# Patient Record
Sex: Female | Born: 1945 | Race: Black or African American | Hispanic: No | Smoking: Never smoker
Health system: Southern US, Community
[De-identification: ages and names within clinical notes are randomized; demographics above are authoritative.]

## PROBLEM LIST (undated history)

## (undated) DIAGNOSIS — R011 Cardiac murmur, unspecified: Secondary | ICD-10-CM

## (undated) DIAGNOSIS — I10 Essential (primary) hypertension: Secondary | ICD-10-CM

## (undated) DIAGNOSIS — T7840XA Allergy, unspecified, initial encounter: Secondary | ICD-10-CM

## (undated) HISTORY — DX: Cardiac murmur, unspecified: R01.1

## (undated) HISTORY — DX: Essential (primary) hypertension: I10

## (undated) HISTORY — DX: Allergy, unspecified, initial encounter: T78.40XA

---

## 2015-01-19 ENCOUNTER — Encounter: Payer: Self-pay | Admitting: *Deleted

## 2015-03-06 ENCOUNTER — Encounter: Payer: Self-pay | Admitting: Family Medicine

## 2015-03-06 ENCOUNTER — Ambulatory Visit (INDEPENDENT_AMBULATORY_CARE_PROVIDER_SITE_OTHER): Payer: Medicare Other | Admitting: Family Medicine

## 2015-03-06 VITALS — BP 128/74 | HR 68 | Temp 98.3°F | Resp 14 | Ht 68.0 in | Wt 176.0 lb

## 2015-03-06 DIAGNOSIS — Z0000 Encounter for general adult medical examination without abnormal findings: Secondary | ICD-10-CM

## 2015-03-06 DIAGNOSIS — J302 Other seasonal allergic rhinitis: Secondary | ICD-10-CM

## 2015-03-06 DIAGNOSIS — Z124 Encounter for screening for malignant neoplasm of cervix: Secondary | ICD-10-CM | POA: Diagnosis not present

## 2015-03-06 DIAGNOSIS — Z1239 Encounter for other screening for malignant neoplasm of breast: Secondary | ICD-10-CM

## 2015-03-06 DIAGNOSIS — Z23 Encounter for immunization: Secondary | ICD-10-CM | POA: Diagnosis not present

## 2015-03-06 DIAGNOSIS — Z13820 Encounter for screening for osteoporosis: Secondary | ICD-10-CM

## 2015-03-06 DIAGNOSIS — Z1321 Encounter for screening for nutritional disorder: Secondary | ICD-10-CM

## 2015-03-06 DIAGNOSIS — H6120 Impacted cerumen, unspecified ear: Secondary | ICD-10-CM | POA: Insufficient documentation

## 2015-03-06 DIAGNOSIS — I10 Essential (primary) hypertension: Secondary | ICD-10-CM

## 2015-03-06 DIAGNOSIS — H6123 Impacted cerumen, bilateral: Secondary | ICD-10-CM | POA: Diagnosis not present

## 2015-03-06 LAB — CBC WITH DIFFERENTIAL/PLATELET
Basophils Absolute: 0 10*3/uL (ref 0.0–0.1)
Basophils Relative: 0 % (ref 0–1)
EOS PCT: 3 % (ref 0–5)
Eosinophils Absolute: 0.1 10*3/uL (ref 0.0–0.7)
HCT: 38.9 % (ref 36.0–46.0)
HEMOGLOBIN: 12.8 g/dL (ref 12.0–15.0)
LYMPHS ABS: 1.7 10*3/uL (ref 0.7–4.0)
LYMPHS PCT: 39 % (ref 12–46)
MCH: 30.2 pg (ref 26.0–34.0)
MCHC: 32.9 g/dL (ref 30.0–36.0)
MCV: 91.7 fL (ref 78.0–100.0)
MPV: 9.7 fL (ref 8.6–12.4)
Monocytes Absolute: 0.2 10*3/uL (ref 0.1–1.0)
Monocytes Relative: 5 % (ref 3–12)
Neutro Abs: 2.3 10*3/uL (ref 1.7–7.7)
Neutrophils Relative %: 53 % (ref 43–77)
Platelets: 261 10*3/uL (ref 150–400)
RBC: 4.24 MIL/uL (ref 3.87–5.11)
RDW: 14.2 % (ref 11.5–15.5)
WBC: 4.4 10*3/uL (ref 4.0–10.5)

## 2015-03-06 LAB — TSH: TSH: 1.961 u[IU]/mL (ref 0.350–4.500)

## 2015-03-06 MED ORDER — AMLODIPINE BESYLATE 10 MG PO TABS: 10.0000 mg | ORAL_TABLET | Freq: Every day | ORAL | Status: DC

## 2015-03-06 NOTE — Progress Notes (Signed)
Patient ID: Tonya Pacheco, female   DOB: 1946/06/09, 69 y.o.   MRN: [id number]   Subjective:    Patient ID: Tonya Pacheco, female    DOB: 15-Aug-1946, 69 y.o.   MRN: [id number]  Patient presents for New Patient CPE with PAP  ratio to establish care and for new patient physical. Her previous PCP is Dr. Duard Larsen. She does not see any specialists. 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 She was born and raised in [place] she has a lot of family members here. She has 3 children who are all healthy.  History of hypertension and is was diagnosed greater than 5 years ago she has been on amlodipine 5 mg which she has done well with. She also a history of a  Of heart murmur but she did not have her see cardiology for this she has never had an echocardiogram done. Fish history of mild hyperlipidemia she was tried on Lipitor and Crestor but has significant joint pains of both of these medications therefore she's been trying to manage with her diet.    she's had some mild cough and congestion no fever mostly  And she is outside working in her garden. She does not take any antihistamines.  Colonoscopy 5 or 6 years ago.  Pap smear greater than 3 years ago mammogram greater than 3 years ago. She has never had a bone density.    she has not had shingles vaccine, pneumonia vaccine. She thinks her tetanus is up-to-date. She has not had a welcome to Medicare physical    Review Past Medical/Family/Social: per EMR   Risk Factors  Current exercise habits: walks Dietary issues discussed: None  Cardiac risk factors: HTN  Depression Screen  (Note: if answer to either of the following is "Yes", a more complete depression screening is indicated)  Over the past two weeks, have you felt down, depressed or hopeless? No Over the past two weeks, have you felt little interest or pleasure in doing things? No Have you lost  interest or pleasure in daily life? No Do you often feel hopeless? No Do you cry easily over simple problems? No   Activities of Daily Living  In your present state of health, do you have any difficulty performing the following activities?:  Driving? No  Managing money? No  Feeding yourself? No  Getting from bed to chair? No  Climbing a flight of stairs? No  Preparing food and eating?: No  Bathing or showering? No  Getting dressed: No  Getting to the toilet? No  Using the toilet:No  Moving around from place to place: No  In the past year have you fallen or had a near fall?:No  Are you sexually active? No  Do you have more than one partner? No   Hearing Difficulties: No  Do you often ask people to speak up or repeat themselves? No  Do you experience ringing or noises in your ears? No Do you have difficulty understanding soft or whispered voices? No  Do you feel that you have a problem with memory? No Do you often misplace items? No  Do you feel safe at home? Yes  Cognitive Testing  Alert? Yes Normal Appearance?Yes  Oriented to person? Yes Place? Yes  Time? Yes  Recall of three objects? Yes  Can perform simple calculations? Yes  Displays appropriate judgment?Yes  Can read the correct time from a watch  face?Yes   List the Names of Other Physician/Practitioners you currently use: None    Screening Tests / Date Colonoscopy   - UTD per pt                 Zostavax - Due Mammogram - Overdue Influenza Vaccine - Declines Tetanus/tdap- UTD  ROS: GEN- denies fatigue, fever, weight loss,weakness, recent illness HEENT- denies eye drainage, change in vision, nasal discharge, CVS- denies chest pain, palpitations RESP- denies SOB, cough, wheeze ABD- denies N/V, change in stools, abd pain GU- denies dysuria, hematuria, dribbling, incontinence MSK- denies joint pain, muscle aches, injury Neuro- denies headache, dizziness, syncope, seizure activity   PHYSICAL: GEN- NAD, alert  and oriented x3 HEENT- PERRL, EOMI, non injected sclera, pink conjunctiva, MMM, oropharynx clear, bilat ears wax impaction Neck- Supple, no thryomegaly CVS- RRR, no murmur Breast- normal symmetry, no nipple inversion,no nipple drainage, no nodules or lumps felt RESP-CTAB Nodes- no axillary nodes GU- normal external genitalia, vaginal mucosa pink and moist, cervix visualized no growth, no blood form os, minimal thin clear discharge, no CMT, no ovarian masses, uterus normal size but prolapsed Rectum- normal tone,FOBT neg, ext skin tags Psych- normal affect and mood EXT- No edema Pulses- Radial, DP- 2+    Assessment:    Annual wellness medicare exam   Plan:    During the course of the visit the patient was educated and counseled about appropriate screening and preventive services including:  Screening mammography  Colorectal cancer screening - obtain records  Bone density to be scheduled  Prevnar 13 given  Shingles vaccine. Prescription given to that she can get the vaccine at the pharmacy or Medicare part D.   Pt has end of life care/will set up   Diet review for nutrition referral? Yes ____ Not Indicated __x__  Patient Instructions (the written plan) was given to the patient.  Medicare Attestation  I have personally reviewed:  The patient's medical and social history  Their use of alcohol, tobacco or illicit drugs  Their current medications and supplements  The patient's functional ability including ADLs,fall risks, home safety risks, cognitive, and hearing and visual impairment  Diet and physical activities  Evidence for depression or mood disorders  The patient's weight, height, BMI, and visual acuity have been recorded in the chart. I have made referrals, counseling, and provided education to the patient based on review of the above and I have provided the patient with a written personalized care plan for preventive services.                  Assessment &  Plan:      Problem List Items Addressed This Visit    Seasonal allergies    Cough due to pollen, OTC anti-hismtaine      Essential hypertension, benign    Continue norvasc 5mg   Fasting labs today      Relevant Medications   aspirin 81 MG tablet   amLODipine (NORVASC) 10 MG tablet   Other Relevant Orders   CBC with Differential/Platelet (Completed)   Comprehensive metabolic panel (Completed)   Lipid panel (Completed)   TSH (Completed)   Cerumen impaction    S/p irrigation at bedside       Other Visit Diagnoses    Routine general medical examination at a health care facility    -  Primary    Breast cancer screening        Relevant Orders    MM DIGITAL SCREENING BILATERAL  Osteoporosis screening        Relevant Orders    DG Bone Density    Cervical cancer screening        Relevant Orders    PAP, ThinPrep ASCUS Rflx HPV Rflx Type    Encounter for vitamin deficiency screening        Relevant Orders    Vit D  25 hydroxy (rtn osteoporosis monitoring) (Completed)       Note: This dictation was prepared with Dragon dictation along with smaller phrase technology. Any transcriptional errors that result from this process are unintentional.

## 2015-03-06 NOTE — Patient Instructions (Addendum)
Release of records- [facility] [place] I recommend eye visit once a year I recommend dental visit every 6 months Goal is to  Exercise 30 minutes 5 days a week We will send a letter with lab results  Prevnar 13 given Shingles vaccine sent to your pharmacy Schedule your Mammogram and Bone Density Continue blood pressure medication Try anti-histamine for the allergies F/U 6 months or as needed

## 2015-03-07 LAB — COMPREHENSIVE METABOLIC PANEL
ALT: 13 U/L (ref 0–35)
AST: 15 U/L (ref 0–37)
Albumin: 4.1 g/dL (ref 3.5–5.2)
Alkaline Phosphatase: 67 U/L (ref 39–117)
BILIRUBIN TOTAL: 0.4 mg/dL (ref 0.2–1.2)
BUN: 15 mg/dL (ref 6–23)
CALCIUM: 9.3 mg/dL (ref 8.4–10.5)
CHLORIDE: 106 meq/L (ref 96–112)
CO2: 25 meq/L (ref 19–32)
CREATININE: 0.76 mg/dL (ref 0.50–1.10)
Glucose, Bld: 106 mg/dL — ABNORMAL HIGH (ref 70–99)
Potassium: 4.2 mEq/L (ref 3.5–5.3)
SODIUM: 140 meq/L (ref 135–145)
Total Protein: 6.9 g/dL (ref 6.0–8.3)

## 2015-03-07 LAB — LIPID PANEL
Cholesterol: 273 mg/dL — ABNORMAL HIGH (ref 0–200)
HDL: 82 mg/dL (ref >=46)
LDL Cholesterol: 177 mg/dL — ABNORMAL HIGH (ref 0–99)
Total CHOL/HDL Ratio: 3.3 Ratio
Triglycerides: 70 mg/dL (ref <150)
VLDL: 14 mg/dL (ref 0–40)

## 2015-03-07 LAB — VITAMIN D 25 HYDROXY (VIT D DEFICIENCY, FRACTURES): Vit D, 25-Hydroxy: 27 ng/mL — ABNORMAL LOW (ref 30–100)

## 2015-03-07 LAB — PAP THINPREP ASCUS RFLX HPV RFLX TYPE

## 2015-03-07 MED ORDER — ZOSTER VACCINE LIVE 19400 UNT/0.65ML ~~LOC~~ SOLR: 0.6500 mL | Freq: Once | SUBCUTANEOUS | Status: DC

## 2015-03-07 NOTE — Assessment & Plan Note (Signed)
S/p irrigation at bedside 

## 2015-03-07 NOTE — Assessment & Plan Note (Signed)
Continue norvasc 5mg   Fasting labs today

## 2015-03-07 NOTE — Addendum Note (Signed)
Addended by: Phillips OdorSIX, Jaylenn Baiza H on: 03/07/2015 10:03 AM   Modules accepted: Orders

## 2015-03-07 NOTE — Assessment & Plan Note (Signed)
Cough due to pollen, OTC anti-hismtaine

## 2015-03-10 ENCOUNTER — Other Ambulatory Visit: Payer: Self-pay | Admitting: *Deleted

## 2015-03-10 MED ORDER — VITAMIN D2 50 MCG (2000 UT) PO TABS: 2000.0000 [IU] | ORAL_TABLET | Freq: Every day | ORAL | Status: DC

## 2015-03-10 MED ORDER — EZETIMIBE 10 MG PO TABS: 10.0000 mg | ORAL_TABLET | Freq: Every day | ORAL | Status: DC

## 2015-03-21 ENCOUNTER — Telehealth: Payer: Self-pay | Admitting: Family Medicine

## 2015-03-21 NOTE — Telephone Encounter (Signed)
Has been on Zetia for about a week now.  She says her eyes are puffy and her tongue feels like it wants to swell.  Is this from Zetia??

## 2015-03-21 NOTE — Telephone Encounter (Signed)
Possible reaction, though not typical, have her stop the Zetia , take benadryl every 6 hours, go to ER if she gets tongue swelling

## 2015-03-22 NOTE — Telephone Encounter (Signed)
Pt made aware of provider recommendations.  Zetia added to allergy list

## 2015-03-29 ENCOUNTER — Ambulatory Visit (HOSPITAL_COMMUNITY): Payer: Self-pay

## 2015-03-29 ENCOUNTER — Ambulatory Visit (HOSPITAL_COMMUNITY)
Admission: RE | Admit: 2015-03-29 | Discharge: 2015-03-29 | Disposition: A | Discharge status: Home / Self Care | Payer: Medicare Other | Source: Ambulatory Visit | Attending: Family Medicine | Admitting: Family Medicine

## 2015-03-29 ENCOUNTER — Other Ambulatory Visit: Payer: Self-pay | Admitting: Family Medicine

## 2015-03-29 DIAGNOSIS — Z1231 Encounter for screening mammogram for malignant neoplasm of breast: Secondary | ICD-10-CM | POA: Diagnosis present

## 2015-04-10 ENCOUNTER — Ambulatory Visit (HOSPITAL_COMMUNITY)
Admission: RE | Admit: 2015-04-10 | Discharge: 2015-04-10 | Disposition: A | Discharge status: Home / Self Care | Payer: Medicare Other | Source: Ambulatory Visit | Attending: Family Medicine | Admitting: Family Medicine

## 2015-04-10 DIAGNOSIS — Z780 Asymptomatic menopausal state: Secondary | ICD-10-CM | POA: Insufficient documentation

## 2015-04-10 DIAGNOSIS — Z13820 Encounter for screening for osteoporosis: Secondary | ICD-10-CM | POA: Diagnosis present

## 2015-04-12 ENCOUNTER — Encounter: Payer: Self-pay | Admitting: *Deleted

## 2015-09-06 ENCOUNTER — Encounter: Payer: Self-pay | Admitting: Family Medicine

## 2015-09-06 ENCOUNTER — Ambulatory Visit (INDEPENDENT_AMBULATORY_CARE_PROVIDER_SITE_OTHER): Payer: Medicare Other | Admitting: Family Medicine

## 2015-09-06 VITALS — BP 134/76 | HR 74 | Temp 97.9°F | Resp 12 | Ht 68.0 in | Wt 177.0 lb

## 2015-09-06 DIAGNOSIS — I10 Essential (primary) hypertension: Secondary | ICD-10-CM | POA: Diagnosis not present

## 2015-09-06 DIAGNOSIS — E785 Hyperlipidemia, unspecified: Secondary | ICD-10-CM

## 2015-09-06 NOTE — Patient Instructions (Signed)
Work on [employer] diet like we discussed Recheck labs in 3 months F/U 6 months  Fat and Cholesterol Restricted Diet Getting too much fat and cholesterol in your diet may cause health problems. Following this diet helps keep your fat and cholesterol at normal levels. This can keep you from getting sick. WHAT TYPES OF FAT SHOULD I CHOOSE?  Choose monosaturated and polyunsaturated fats. These are found in foods such as olive oil, canola oil, flaxseeds, walnuts, almonds, and seeds.  Eat more omega-3 fats. Good choices include salmon, mackerel, sardines, tuna, flaxseed oil, and ground flaxseeds.  Limit saturated fats. These are in animal products such as meats, butter, and cream. They can also be in plant products such as palm oil, palm kernel oil, and coconut oil.   Avoid foods with partially hydrogenated oils in them. These contain trans fats. Examples of foods that have trans fats are stick margarine, some tub margarines, cookies, crackers, and other baked goods. WHAT GENERAL GUIDELINES DO I NEED TO FOLLOW?   Check food labels. Look for the words "trans fat" and "saturated fat."  When preparing a meal:  Fill half of your plate with vegetables and green salads.  Fill one fourth of your plate with whole grains. Look for the word "whole" as the first word in the ingredient list.  Fill one fourth of your plate with lean protein foods.  Limit fruit to two servings a day. Choose fruit instead of juice.  Eat more foods with soluble fiber. Examples of foods with this type of fiber are apples, broccoli, carrots, beans, peas, and barley. Try to get 20-30 g (grams) of fiber per day.  Eat more home-cooked foods. Eat less at restaurants and buffets.  Limit or avoid alcohol.  Limit foods high in starch and sugar.  Limit fried foods.  Cook foods without frying them. Baking, boiling, grilling, and broiling are all great options.  Lose weight if you are overweight. Losing even a small amount of weight  can help your overall health. It can also help prevent diseases such as diabetes and heart disease. WHAT FOODS CAN I EAT? Grains Whole grains, such as whole wheat or whole grain breads, crackers, cereals, and pasta. Unsweetened oatmeal, bulgur, barley, quinoa, or brown rice. Corn or whole wheat flour tortillas. Vegetables Fresh or frozen vegetables (raw, steamed, roasted, or grilled). Green salads. Fruits All fresh, canned (in natural juice), or frozen fruits. Meat and Other Protein Products Ground beef (85% or leaner), grass-fed beef, or beef trimmed of fat. Skinless chicken or [place]. Ground chicken or [place]. Pork trimmed of fat. All fish and seafood. Eggs. Dried beans, peas, or lentils. Unsalted nuts or seeds. Unsalted canned or dry beans. Dairy Low-fat dairy products, such as skim or 1% milk, 2% or reduced-fat cheeses, low-fat ricotta or cottage cheese, or plain low-fat yogurt. Fats and Oils Tub margarines without trans fats. Light or reduced-fat mayonnaise and salad dressings. Avocado. Olive, canola, sesame, or safflower oils. Natural peanut or almond butter (choose ones without added sugar and oil). The items listed above may not be a complete list of recommended foods or beverages. Contact your dietitian for more options. WHAT FOODS ARE NOT RECOMMENDED? Grains White bread. White pasta. White rice. Cornbread. Bagels, pastries, and croissants. Crackers that contain trans fat. Vegetables White potatoes. Corn. Creamed or fried vegetables. Vegetables in a cheese sauce. Fruits Dried fruits. Canned fruit in light or heavy syrup. Fruit juice. Meat and Other Protein Products Fatty cuts of meat. Ribs, chicken wings, bacon, sausage,  bologna, salami, chitterlings, fatback, hot dogs, bratwurst, and packaged luncheon meats. Liver and organ meats. Dairy Whole or 2% milk, cream, half-and-half, and cream cheese. Whole milk cheeses. Whole-fat or sweetened yogurt. Full-fat cheeses. Nondairy creamers  and whipped toppings. Processed cheese, cheese spreads, or cheese curds. Sweets and Desserts Corn syrup, sugars, honey, and molasses. Candy. Jam and jelly. Syrup. Sweetened cereals. Cookies, pies, cakes, donuts, muffins, and ice cream. Fats and Oils Butter, stick margarine, lard, shortening, ghee, or bacon fat. Coconut, palm kernel, or palm oils. Beverages Alcohol. Sweetened drinks (such as sodas, lemonade, and fruit drinks or punches). The items listed above may not be a complete list of foods and beverages to avoid. Contact your dietitian for more information.   This information is not intended to replace advice given to you by your health care provider. Make sure you discuss any questions you have with your health care provider.   Document Released: 04/14/2012 Document Revised: 11/04/2014 Document Reviewed: 01/13/2014 Elsevier Interactive Patient Education [employer]

## 2015-09-06 NOTE — Assessment & Plan Note (Signed)
Blood pressure well controlled to change medication 

## 2015-09-06 NOTE — Assessment & Plan Note (Signed)
I spent approx 15 minutes with patient with greater than 50% spent on counseling for nutrition and cholesterol she will return in 3 months to have her fasting labs done. She was given handouts about cholesterol which foods she incorporate into her diet.

## 2015-09-06 NOTE — Progress Notes (Signed)
Patient ID: Tonya Pacheco, female   DOB: 03-Dec-1945, 69 y.o.   MRN: [id number]   Subjective:    Patient ID: Tonya Pacheco, female    DOB: 03-Dec-1945, 69 y.o.   MRN: [id number]  Patient presents for 6 month F/U  patient here to follow-up medications. She has hypertension and hyperlipidemia. She wanted to discuss the foods that she was eating how this contributes to her cholesterol. She is intolerant of statins we try her on Saturday of this also caused side effects. She is taking fish oil. She does not want to have her labs checked today because she did not change anything with her diagnosis and her cholesterol still be elevated.    Review Of Systems:  GEN- denies fatigue, fever, weight loss,weakness, recent illness HEENT- denies eye drainage, change in vision, nasal discharge, CVS- denies chest pain, palpitations RESP- denies SOB, cough, wheeze ABD- denies N/V, change in stools, abd pain GU- denies dysuria, hematuria, dribbling, incontinence MSK- denies joint pain, muscle aches, injury Neuro- denies headache, dizziness, syncope, seizure activity       Objective:    BP 134/76 mmHg  Pulse 74  Temp(Src) 97.9 F (36.6 C) (Oral)  Resp 12  Ht 5\' 8"  (1.727 m)  Wt 177 lb (80.287 kg)  BMI 26.92 kg/m2 GEN- NAD, alert and oriented x3 CVS- RRR,3/6 SEM murmur RESP-CTAB EXT- No edema Pulses- Radial - 2+        Assessment & Plan:      Problem List Items Addressed This Visit    Hyperlipidemia    I spent approx 15 minutes with patient with greater than 50% spent on counseling for nutrition and cholesterol she will return in 3 months to have her fasting labs done. She was given handouts about cholesterol which foods she incorporate into her diet.      Relevant Orders   Lipid panel   Essential hypertension, benign - Primary    Blood pressure well controlled to change medication      Relevant Orders   Comprehensive metabolic panel      Note: This dictation was prepared with Dragon  dictation along with smaller phrase technology. Any transcriptional errors that result from this process are unintentional.

## 2015-12-07 ENCOUNTER — Ambulatory Visit: Payer: Medicare Other

## 2016-06-18 ENCOUNTER — Other Ambulatory Visit: Payer: Self-pay | Admitting: Family Medicine

## 2016-06-19 ENCOUNTER — Encounter: Payer: Self-pay | Admitting: *Deleted

## 2016-06-19 NOTE — Telephone Encounter (Signed)
Medication filled x1 with no refills.   Requires office visit before any further refills can be given.   Letter sent.  

## 2016-07-30 ENCOUNTER — Ambulatory Visit (INDEPENDENT_AMBULATORY_CARE_PROVIDER_SITE_OTHER): Payer: Medicare Other | Admitting: Family Medicine

## 2016-07-30 ENCOUNTER — Encounter: Payer: Self-pay | Admitting: Family Medicine

## 2016-07-30 VITALS — BP 134/74 | HR 86 | Temp 98.3°F | Resp 12 | Ht 68.0 in | Wt 178.0 lb

## 2016-07-30 DIAGNOSIS — Z0000 Encounter for general adult medical examination without abnormal findings: Secondary | ICD-10-CM | POA: Diagnosis not present

## 2016-07-30 DIAGNOSIS — E7800 Pure hypercholesterolemia, unspecified: Secondary | ICD-10-CM | POA: Diagnosis not present

## 2016-07-30 DIAGNOSIS — M8589 Other specified disorders of bone density and structure, multiple sites: Secondary | ICD-10-CM

## 2016-07-30 DIAGNOSIS — M8580 Other specified disorders of bone density and structure, unspecified site: Secondary | ICD-10-CM | POA: Insufficient documentation

## 2016-07-30 DIAGNOSIS — I10 Essential (primary) hypertension: Secondary | ICD-10-CM

## 2016-07-30 DIAGNOSIS — H6123 Impacted cerumen, bilateral: Secondary | ICD-10-CM | POA: Diagnosis not present

## 2016-07-30 DIAGNOSIS — E559 Vitamin D deficiency, unspecified: Secondary | ICD-10-CM | POA: Diagnosis not present

## 2016-07-30 DIAGNOSIS — Z23 Encounter for immunization: Secondary | ICD-10-CM

## 2016-07-30 LAB — CBC WITH DIFFERENTIAL/PLATELET
Basophils Absolute: 0 cells/uL (ref 0–200)
Basophils Relative: 0 %
EOS ABS: 138 {cells}/uL (ref 15–500)
EOS PCT: 3 %
HEMATOCRIT: 39 % (ref 35.0–45.0)
Hemoglobin: 13 g/dL (ref 12.0–15.0)
LYMPHS PCT: 39 %
Lymphs Abs: 1794 cells/uL (ref 850–3900)
MCH: 30.6 pg (ref 27.0–33.0)
MCHC: 33.3 g/dL (ref 32.0–36.0)
MCV: 91.8 fL (ref 80.0–100.0)
MONOS PCT: 6 %
MPV: 9.7 fL (ref 7.5–12.5)
Monocytes Absolute: 276 cells/uL (ref 200–950)
Neutro Abs: 2392 cells/uL (ref 1500–7800)
Neutrophils Relative %: 52 %
Platelets: 218 10*3/uL (ref 140–400)
RBC: 4.25 MIL/uL (ref 3.80–5.10)
RDW: 14 % (ref 11.0–15.0)
WBC: 4.6 10*3/uL (ref 3.8–10.8)

## 2016-07-30 NOTE — Assessment & Plan Note (Signed)
Pt did not tolerate irrigation at bedside, can use peroxide rinse at thome

## 2016-07-30 NOTE — Assessment & Plan Note (Signed)
Well controlled 

## 2016-07-30 NOTE — Progress Notes (Signed)
Subjective:   Patient presents for Medicare Annual/Subsequent preventive examination.   Hypertension- taking BP meds as prescribed  Specific concerns today. He is taking her vitamin supplements as well. Occasionally she gets some back pain which she is lifting things above her head especially [place] she moves things around like rocks. Denies any change in bowel or bladder TO numbness pain does not last.  Review Past Medical/Family/Social: per EMR    Risk Factors  Current exercise habits: walks, active in garden Dietary issues discussed: yes  Cardiac risk factors: Cholesterol, hypertension   Depression Screen  (Note: if answer to either of the following is "Yes", a more complete depression screening is indicated)  Over the past two weeks, have you felt down, depressed or hopeless? No Over the past two weeks, have you felt little interest or pleasure in doing things? No Have you lost interest or pleasure in daily life? No Do you often feel hopeless? No Do you cry easily over simple problems? No   Activities of Daily Living  In your present state of health, do you have any difficulty performing the following activities?:  Driving? No  Managing money? No  Feeding yourself? No  Getting from bed to chair? No  Climbing a flight of stairs? No  Preparing food and eating?: No  Bathing or showering? No  Getting dressed: No  Getting to the toilet? No  Using the toilet:No  Moving around from place to place: No  In the past year have you fallen or had a near fall?:No  Are you sexually active? No  Do you have more than one partner? No   Hearing Difficulties: No  Do you often ask people to speak up or repeat themselves? No  Do you experience ringing or noises in your ears? No Do you have difficulty understanding soft or whispered voices? No  Do you feel that you have a problem with memory? No Do you often misplace items? No  Do you feel safe at home? Yes  Cognitive Testing   Alert? Yes Normal Appearance?Yes  Oriented to person? Yes Place? Yes  Time? Yes  Recall of three objects? Yes  Can perform simple calculations? Yes  Displays appropriate judgment?Yes  Can read the correct time from a watch face?Yes   List the Names of Other Physician/Practitioners you currently use:   Screening Tests / Date Colonoscopy    UTD per pt                  Zostavax  Declines  Mammogram UTD Bone Density- UTD  Influenza Vaccine Declines Tetanus/tdap UTD  Pneumonia- Due for pneumonia vaccine 23   ROS: GEN- denies fatigue, fever, weight loss,weakness, recent illness HEENT- denies eye drainage, change in vision, nasal discharge, CVS- denies chest pain, palpitations RESP- denies SOB, cough, wheeze ABD- denies N/V, change in stools, abd pain GU- denies dysuria, hematuria, dribbling, incontinence MSK- + joint pain, muscle aches, injury Neuro- denies headache, dizziness, syncope, seizure activity  PHYSICAL: GEN- NAD, alert and oriented x3 HEENT- PERRL, EOMI, non injected sclera, pink conjunctiva, MMM, oropharynx clear, Bilat canals impacted with wax, hearing in tact  Neck- Supple, no thryomegaly,no bruits CVS- RRR,3/6 SEM RESP-CTAB ABD-NABS,soft,NT,ND MSK- Spine NT, good ROM, neg SLR, good ROM bilat Hips EXT- No edema Pulses- Radial, DP- 2+   Assessment:    Annual wellness medicare exam   Plan:    During the course of the visit the patient was educated and counseled about appropriate screening and preventive services  including:  Screening mammography - every 2 years  Colorectal cancer screening -try to get report from [place] again, told normal, so would be due in 2021  Fasting labs today for cholesterol   Screen NEG for depression.   Diet review for nutrition referral? Yes ____ Not Indicated __x__  Patient Instructions (the written plan) was given to the patient.  Medicare Attestation  I have personally reviewed:  The patient's medical and social history   Their use of alcohol, tobacco or illicit drugs  Their current medications and supplements  The patient's functional ability including ADLs,fall risks, home safety risks, cognitive, and hearing and visual impairment  Diet and physical activities  Evidence for depression or mood disorders  The patient's weight, height, BMI, and visual acuity have been recorded in the chart. I have made referrals, counseling, and provided education to the patient based on review of the above and I have provided the patient with a written personalized care plan for preventive services.

## 2016-07-30 NOTE — Patient Instructions (Addendum)
Release of records- [facility] [place]- Need Colonoscopy report  I recommend eye visit once a year I recommend dental visit every 6 months Goal is to  Exercise 30 minutes 5 days a week We will send a letter with lab results  F/U 1 year for Physical

## 2016-07-30 NOTE — Assessment & Plan Note (Signed)
Continue Vitamin D weight bearing exercise, recheck in 2017

## 2016-07-31 LAB — COMPREHENSIVE METABOLIC PANEL
ALK PHOS: 75 U/L (ref 33–130)
ALT: 12 U/L (ref 6–29)
AST: 17 U/L (ref 10–35)
Albumin: 4.1 g/dL (ref 3.6–5.1)
BUN: 16 mg/dL (ref 7–25)
CALCIUM: 9.1 mg/dL (ref 8.6–10.4)
CO2: 28 mmol/L (ref 20–31)
Chloride: 105 mmol/L (ref 98–110)
Creat: 1 mg/dL — ABNORMAL HIGH (ref 0.50–0.99)
Glucose, Bld: 88 mg/dL (ref 70–99)
POTASSIUM: 4.2 mmol/L (ref 3.5–5.3)
Sodium: 140 mmol/L (ref 135–146)
Total Bilirubin: 0.6 mg/dL (ref 0.2–1.2)
Total Protein: 6.9 g/dL (ref 6.1–8.1)

## 2016-07-31 LAB — LIPID PANEL
CHOL/HDL RATIO: 4 ratio (ref <=5.0)
Cholesterol: 282 mg/dL — ABNORMAL HIGH (ref 125–200)
HDL: 71 mg/dL (ref >=46)
LDL CALC: 193 mg/dL — AB (ref <130)
Triglycerides: 88 mg/dL (ref <150)
VLDL: 18 mg/dL (ref <30)

## 2016-07-31 LAB — VITAMIN D 25 HYDROXY (VIT D DEFICIENCY, FRACTURES): VIT D 25 HYDROXY: 31 ng/mL (ref 30–100)

## 2016-08-15 ENCOUNTER — Other Ambulatory Visit: Payer: Self-pay | Admitting: Family Medicine

## 2016-10-11 ENCOUNTER — Other Ambulatory Visit: Payer: Self-pay | Admitting: Family Medicine

## 2016-12-17 ENCOUNTER — Telehealth: Payer: Self-pay | Admitting: Family Medicine

## 2016-12-17 MED ORDER — AMLODIPINE BESYLATE 10 MG PO TABS: 10.0000 mg | ORAL_TABLET | Freq: Every day | ORAL | 3 refills | Status: DC

## 2016-12-17 NOTE — Telephone Encounter (Signed)
Pt needs refill on norvasc sent to [facility] on [address]

## 2016-12-17 NOTE — Telephone Encounter (Signed)
Prescription sent to pharmacy.

## 2017-07-21 ENCOUNTER — Other Ambulatory Visit: Payer: Self-pay | Admitting: Family Medicine

## 2017-07-21 NOTE — Telephone Encounter (Signed)
Medication refilled per protocol. 

## 2017-08-01 ENCOUNTER — Encounter: Payer: Medicare Other | Admitting: Family Medicine

## 2017-10-07 ENCOUNTER — Encounter: Payer: Medicare Other | Admitting: Family Medicine

## 2017-12-29 ENCOUNTER — Encounter: Payer: Self-pay | Admitting: Family Medicine

## 2017-12-29 ENCOUNTER — Ambulatory Visit (INDEPENDENT_AMBULATORY_CARE_PROVIDER_SITE_OTHER): Payer: Medicare Other | Admitting: Family Medicine

## 2017-12-29 ENCOUNTER — Other Ambulatory Visit: Payer: Self-pay

## 2017-12-29 VITALS — BP 124/60 | HR 72 | Temp 98.4°F | Resp 14 | Ht 68.0 in | Wt 178.0 lb

## 2017-12-29 DIAGNOSIS — I10 Essential (primary) hypertension: Secondary | ICD-10-CM

## 2017-12-29 DIAGNOSIS — Z1231 Encounter for screening mammogram for malignant neoplasm of breast: Secondary | ICD-10-CM

## 2017-12-29 DIAGNOSIS — M8589 Other specified disorders of bone density and structure, multiple sites: Secondary | ICD-10-CM | POA: Diagnosis not present

## 2017-12-29 DIAGNOSIS — Z1159 Encounter for screening for other viral diseases: Secondary | ICD-10-CM

## 2017-12-29 DIAGNOSIS — H6123 Impacted cerumen, bilateral: Secondary | ICD-10-CM

## 2017-12-29 DIAGNOSIS — Z1239 Encounter for other screening for malignant neoplasm of breast: Secondary | ICD-10-CM

## 2017-12-29 DIAGNOSIS — Z0000 Encounter for general adult medical examination without abnormal findings: Secondary | ICD-10-CM | POA: Diagnosis not present

## 2017-12-29 DIAGNOSIS — E7800 Pure hypercholesterolemia, unspecified: Secondary | ICD-10-CM

## 2017-12-29 NOTE — Patient Instructions (Signed)
I recommend eye visit once a year I recommend dental visit every 6 months Goal is to  Exercise 30 minutes 5 days a week We will call with lab results  F/U 1 year for physical    

## 2017-12-29 NOTE — Progress Notes (Signed)
Subjective:   Patient presents for Medicare Annual/Subsequent preventive examination.     [facility] a [facility]    Recently seen Dentist- given partial      HTN- taking 5mg  of norvasc    Trying to watch diet      No concerns  Review Past Medical/Family/Social:   Risk Factors  Current exercise habits: Exercise water  Dietary issues discussed: None   Cardiac risk factors: Obesity (BMI >= 30 kg/m2).   Depression Screen  (Note: if answer to either of the following is "Yes", a more complete depression screening is indicated)  Over the past two weeks, have you felt down, depressed or hopeless? No Over the past two weeks, have you felt little interest or pleasure in doing things? No Have you lost interest or pleasure in daily life? No Do you often feel hopeless? No Do you cry easily over simple problems? No   Activities of Daily Living  In your present state of health, do you have any difficulty performing the following activities?:  Driving? No  Managing money? No  Feeding yourself? No  Getting from bed to chair? No  Climbing a flight of stairs? No  Preparing food and eating?: No  Bathing or showering? No  Getting dressed: No  Getting to the toilet? No  Using the toilet:No  Moving around from place to place: No  In the past year have you fallen or had a near fall?:No  Are you sexually active? No  Do you have more than one partner? No   Hearing Difficulties: No  Do you often ask people to speak up or repeat themselves? No  Do you experience ringing or noises in your ears? No Do you have difficulty understanding soft or whispered voices? No  Do you feel that you have a problem with memory? No Do you often misplace items? No  Do you feel safe at home? Yes  Cognitive Testing  Alert? Yes Normal Appearance?Yes  Oriented to person? Yes Place? Yes  Time? Yes  Recall of three objects? Yes  Can perform simple calculations? Yes  Displays appropriate  judgment?Yes  Can read the correct time from a watch face?Yes   List the Names of Other Physician/Practitioners you currently use:   Screening Tests / Date Colonoscopy    UTD per pt                  Zostavax  Declines  Mammogram UTD Bone Density- UTD  Influenza Vaccine Declines Tetanus/tdap UTD  Pneumonia- UTD  ROS: GEN- denies fatigue, fever, weight loss,weakness, recent illness HEENT- denies eye drainage, change in vision, nasal discharge, CVS- denies chest pain, palpitations RESP- denies SOB, cough, wheeze ABD- denies N/V, change in stools, abd pain GU- denies dysuria, hematuria, dribbling, incontinence MSK- + joint pain, muscle aches, injury Neuro- denies headache, dizziness, syncope, seizure activity  PHYSICAL: GEN- NAD, alert and oriented x3 HEENT- PERRL, EOMI, non injected sclera, pink conjunctiva, MMM, oropharynx clear, Bilat canals impacted with wax, hearing in tact  Neck- Supple, no thryomegaly,no bruits CVS- RRR,3/6 SEM RESP-CTAB ABD-NABS,soft,NT,ND EXT- No edema Pulses- Radial, DP- 2  Assessment:    Annual wellness medicare exam   Plan:    During the course of the visit the patient was educated and counseled about appropriate screening and preventive services including:  Screening mammography/ Bone Density  - pt to schedule  Can not afford SHINGLES    Discussed HEP C screening   Screen Neg  for  depression.   Continue with exercise routine   Fasting labs- checklipids, trying to control with diet  HTN- BP controlled no changes  She has Living will , sister is POA, Full CODE  Osteopenia- continue Ca/vitamin D, weightbearing exercise      Diet review for nutrition referral? Yes ____ Not Indicated __x__  Patient Instructions (the written plan) was given to the patient.  Medicare Attestation  I have personally reviewed:  The patient's medical and social history  Their use of alcohol, tobacco or illicit drugs  Their current medications and  supplements  The patient's functional ability including ADLs,fall risks, home safety risks, cognitive, and hearing and visual impairment  Diet and physical activities  Evidence for depression or mood disorders  The patient's weight, height, BMI, and visual acuity have been recorded in the chart. I have made referrals, counseling, and provided education to the patient based on review of the above and I have provided the patient with a written personalized care plan for preventive services.

## 2017-12-30 LAB — LIPID PANEL
CHOL/HDL RATIO: 3.5 (calc) (ref <5.0)
Cholesterol: 262 mg/dL — ABNORMAL HIGH (ref <200)
HDL: 74 mg/dL (ref >50)
LDL CHOLESTEROL (CALC): 171 mg/dL — AB
Non-HDL Cholesterol (Calc): 188 mg/dL (calc) — ABNORMAL HIGH (ref <130)
Triglycerides: 72 mg/dL (ref <150)

## 2017-12-30 LAB — CBC WITH DIFFERENTIAL/PLATELET
Basophils Absolute: 21 cells/uL (ref 0–200)
Basophils Relative: 0.4 %
EOS PCT: 2.5 %
Eosinophils Absolute: 133 cells/uL (ref 15–500)
HCT: 40.5 % (ref 35.0–45.0)
Hemoglobin: 13.6 g/dL (ref 11.7–15.5)
Lymphs Abs: 2041 cells/uL (ref 850–3900)
MCH: 30.9 pg (ref 27.0–33.0)
MCHC: 33.6 g/dL (ref 32.0–36.0)
MCV: 92 fL (ref 80.0–100.0)
MONOS PCT: 6.8 %
MPV: 10.7 fL (ref 7.5–12.5)
NEUTROS PCT: 51.8 %
Neutro Abs: 2745 cells/uL (ref 1500–7800)
Platelets: 230 10*3/uL (ref 140–400)
RBC: 4.4 10*6/uL (ref 3.80–5.10)
RDW: 12.5 % (ref 11.0–15.0)
Total Lymphocyte: 38.5 %
WBC mixed population: 360 cells/uL (ref 200–950)
WBC: 5.3 10*3/uL (ref 3.8–10.8)

## 2017-12-30 LAB — HEPATITIS C ANTIBODY
Hepatitis C Ab: NONREACTIVE
SIGNAL TO CUT-OFF: 0.02 (ref <1.00)

## 2017-12-30 LAB — COMPREHENSIVE METABOLIC PANEL
AG Ratio: 1.6 (calc) (ref 1.0–2.5)
ALKALINE PHOSPHATASE (APISO): 82 U/L (ref 33–130)
ALT: 13 U/L (ref 6–29)
AST: 16 U/L (ref 10–35)
Albumin: 4.3 g/dL (ref 3.6–5.1)
BUN/Creatinine Ratio: 13 (calc) (ref 6–22)
BUN: 14 mg/dL (ref 7–25)
CO2: 26 mmol/L (ref 20–32)
CREATININE: 1.04 mg/dL — AB (ref 0.50–0.99)
Calcium: 9.3 mg/dL (ref 8.6–10.4)
Chloride: 106 mmol/L (ref 98–110)
GLUCOSE: 100 mg/dL — AB (ref 65–99)
Globulin: 2.7 g/dL (calc) (ref 1.9–3.7)
POTASSIUM: 4.4 mmol/L (ref 3.5–5.3)
SODIUM: 142 mmol/L (ref 135–146)
Total Bilirubin: 0.5 mg/dL (ref 0.2–1.2)
Total Protein: 7 g/dL (ref 6.1–8.1)

## 2018-01-01 ENCOUNTER — Encounter: Payer: Self-pay | Admitting: *Deleted

## 2018-01-10 ENCOUNTER — Other Ambulatory Visit: Payer: Self-pay | Admitting: Family Medicine

## 2018-02-04 ENCOUNTER — Ambulatory Visit (HOSPITAL_COMMUNITY)
Admission: RE | Admit: 2018-02-04 | Discharge: 2018-02-04 | Disposition: A | Discharge status: Home / Self Care | Payer: Medicare Other | Source: Ambulatory Visit | Attending: Family Medicine | Admitting: Family Medicine

## 2018-02-04 ENCOUNTER — Encounter: Payer: Self-pay | Admitting: *Deleted

## 2018-02-04 DIAGNOSIS — M8589 Other specified disorders of bone density and structure, multiple sites: Secondary | ICD-10-CM | POA: Diagnosis not present

## 2018-02-04 DIAGNOSIS — Z1231 Encounter for screening mammogram for malignant neoplasm of breast: Secondary | ICD-10-CM | POA: Insufficient documentation

## 2018-02-04 DIAGNOSIS — Z780 Asymptomatic menopausal state: Secondary | ICD-10-CM | POA: Diagnosis not present

## 2018-02-04 DIAGNOSIS — M8588 Other specified disorders of bone density and structure, other site: Secondary | ICD-10-CM | POA: Diagnosis not present

## 2018-02-04 DIAGNOSIS — Z1239 Encounter for other screening for malignant neoplasm of breast: Secondary | ICD-10-CM

## 2018-07-01 ENCOUNTER — Other Ambulatory Visit: Payer: Self-pay

## 2018-07-01 ENCOUNTER — Emergency Department (HOSPITAL_COMMUNITY)
Admission: EM | Admit: 2018-07-01 | Discharge: 2018-07-01 | Disposition: A | Discharge status: Home / Self Care | Payer: Medicare Other | Attending: Emergency Medicine | Admitting: Emergency Medicine

## 2018-07-01 ENCOUNTER — Encounter (HOSPITAL_COMMUNITY): Payer: Self-pay

## 2018-07-01 DIAGNOSIS — R252 Cramp and spasm: Secondary | ICD-10-CM | POA: Insufficient documentation

## 2018-07-01 DIAGNOSIS — Z79899 Other long term (current) drug therapy: Secondary | ICD-10-CM | POA: Diagnosis not present

## 2018-07-01 DIAGNOSIS — Z7982 Long term (current) use of aspirin: Secondary | ICD-10-CM | POA: Diagnosis not present

## 2018-07-01 DIAGNOSIS — M79605 Pain in left leg: Secondary | ICD-10-CM | POA: Diagnosis present

## 2018-07-01 DIAGNOSIS — M79604 Pain in right leg: Secondary | ICD-10-CM | POA: Diagnosis not present

## 2018-07-01 DIAGNOSIS — I10 Essential (primary) hypertension: Secondary | ICD-10-CM | POA: Diagnosis not present

## 2018-07-01 LAB — CBC WITH DIFFERENTIAL/PLATELET
Basophils Absolute: 0 10*3/uL (ref 0.0–0.1)
Basophils Relative: 0 %
EOS PCT: 4 %
Eosinophils Absolute: 0.2 10*3/uL (ref 0.0–0.7)
HEMATOCRIT: 39.1 % (ref 36.0–46.0)
Hemoglobin: 12.7 g/dL (ref 12.0–15.0)
LYMPHS ABS: 2.3 10*3/uL (ref 0.7–4.0)
LYMPHS PCT: 40 %
MCH: 30.6 pg (ref 26.0–34.0)
MCHC: 32.5 g/dL (ref 30.0–36.0)
MCV: 94.2 fL (ref 78.0–100.0)
MONO ABS: 0.5 10*3/uL (ref 0.1–1.0)
Monocytes Relative: 8 %
NEUTROS ABS: 2.8 10*3/uL (ref 1.7–7.7)
Neutrophils Relative %: 48 %
PLATELETS: 213 10*3/uL (ref 150–400)
RBC: 4.15 MIL/uL (ref 3.87–5.11)
RDW: 13.1 % (ref 11.5–15.5)
WBC: 5.8 10*3/uL (ref 4.0–10.5)

## 2018-07-01 LAB — BASIC METABOLIC PANEL
ANION GAP: 6 (ref 5–15)
BUN: 19 mg/dL (ref 8–23)
CO2: 27 mmol/L (ref 22–32)
Calcium: 9 mg/dL (ref 8.9–10.3)
Chloride: 107 mmol/L (ref 98–111)
Creatinine, Ser: 0.98 mg/dL (ref 0.44–1.00)
GFR calc Af Amer: >60 mL/min (ref >60)
GFR, EST NON AFRICAN AMERICAN: 57 mL/min — AB (ref >60)
Glucose, Bld: 130 mg/dL — ABNORMAL HIGH (ref 70–99)
Potassium: 4.3 mmol/L (ref 3.5–5.1)
Sodium: 140 mmol/L (ref 135–145)

## 2018-07-01 LAB — CK: Total CK: 390 U/L — ABNORMAL HIGH (ref 38–234)

## 2018-07-01 MED ORDER — METHOCARBAMOL 500 MG PO TABS: 500.0000 mg | ORAL_TABLET | Freq: Three times a day (TID) | ORAL | 0 refills | Status: DC | PRN

## 2018-07-01 MED ORDER — ACETAMINOPHEN 325 MG PO TABS
650.0000 mg | ORAL_TABLET | Freq: Once | ORAL | Status: AC
  Administered 2018-07-01: 650 mg via ORAL
  Filled 2018-07-01: qty 2

## 2018-07-01 MED ORDER — NAPROXEN 500 MG PO TABS: 500.0000 mg | ORAL_TABLET | Freq: Two times a day (BID) | ORAL | 0 refills | Status: DC | PRN

## 2018-07-01 NOTE — ED Triage Notes (Signed)
Pt was working out side Monday for several hours. Today she is experiencing left leg pain that throbs from the hip all the way down to the ankle. Heat has not helped or aleve.

## 2018-07-01 NOTE — ED Provider Notes (Signed)
[facility] EMERGENCY DEPARTMENT Provider Note   CSN: [id number] Arrival date & time: 07/01/18  0059     History   Chief Complaint Chief Complaint  Patient presents with  . Leg Pain    left    HPI Tonya Pacheco is a 72 y.o. female.  Patient presents with pain in her left leg since working outside earlier yesterday.  States she was outside for about 5 or 6 hours doing yard work.  No specific injury.  No fall.  Her pain extends from her left hip all the way down to her ankle.  She took Aleve around 9 PM without relief.  She is also tried heat.  No focal weakness, numbness or tingling.  No fever or vomiting.  No back pain.  No bowel or bladder incontinence.  Denies having this kind of pain in the past.  Believes she might of pulled a muscle.  She is also concerned about her electrolytes as she has been drinking a lot of Gatorade at home.  The history is provided by the patient.  Leg Pain      Past Medical History:  Diagnosis Date  . Hypertension     Patient Active Problem List   Diagnosis Date Noted  . Osteopenia 07/30/2016  . Hyperlipidemia 09/06/2015  . Essential hypertension, benign 03/06/2015  . Seasonal allergies 03/06/2015  . Cerumen impaction 03/06/2015    History reviewed. No pertinent surgical history.   OB History   None      Home Medications    Prior to Admission medications   Medication Sig Start Date End Date Taking? Authorizing Provider  amLODipine (NORVASC) 10 MG tablet TAKE 1 TABLET ONCE DAILY 01/12/18   Salley Scarlet, MD  aspirin 81 MG tablet Take 81 mg by mouth daily.    [provider]  multivitamin-iron-minerals-folic acid (CENTRUM) chewable tablet Chew 1 tablet by mouth daily.    [provider]    Family History Family History  Problem Relation Age of Onset  . Arthritis Sister 30       Rheumatoid Arthritis  . Cancer Maternal Grandmother   . Heart disease Sister   . Lupus Sister   . Kidney disease Brother 32   Kidney transplant    Social History Social History   Tobacco Use  . Smoking status: Never Smoker  . Smokeless tobacco: Never Used  Substance Use Topics  . Alcohol use: No  . Drug use: No     Allergies   Penicillins; Statins; and Zetia [ezetimibe]   Review of Systems Review of Systems  Constitutional: Negative for activity change, appetite change and fever.  HENT: Negative for congestion and rhinorrhea.   Eyes: Negative for visual disturbance.  Respiratory: Negative for cough, chest tightness, shortness of breath and wheezing.   Cardiovascular: Negative for chest pain and palpitations.  Gastrointestinal: Negative for abdominal pain, nausea and vomiting.  Genitourinary: Negative for dysuria, vaginal bleeding and vaginal discharge.  Musculoskeletal: Positive for arthralgias and myalgias.  Neurological: Negative for dizziness, weakness, light-headedness and headaches.   all other systems are negative except as noted in the HPI and PMH.     Physical Exam Updated Vital Signs BP (!) 165/97 (BP Location: Right Arm)   Pulse 77   Temp 98 F (36.7 C) (Oral)   Resp 18   Ht 5\' 7"  (1.702 m)   Wt 80.7 kg   SpO2 100%   BMI 27.88 kg/m   Physical Exam  Constitutional: She is oriented to person,  place, and time. She appears well-developed and well-nourished. No distress.  HENT:  Head: Normocephalic and atraumatic.  Mouth/Throat: Oropharynx is clear and moist. No oropharyngeal exudate.  Eyes: Pupils are equal, round, and reactive to light. Conjunctivae and EOM are normal.  Neck: Normal range of motion. Neck supple.  No meningismus.  Cardiovascular: Normal rate, regular rhythm, normal heart sounds and intact distal pulses.  No murmur heard. Pulmonary/Chest: Effort normal and breath sounds normal. No respiratory distress.  Abdominal: Soft. There is no tenderness. There is no rebound and no guarding.  Musculoskeletal: Normal range of motion. She exhibits no edema or tenderness.    5/5 strength in bilateral lower extremities. Ankle plantar and dorsiflexion intact. Great toe extension intact bilaterally. +2 DP and PT pulses. +2 patellar reflexes bilaterally. Normal gait.  Leg is nontender to palpation.  There is no calf asymmetry or swelling.  No calf tenderness. Compartments are soft  Neurological: She is alert and oriented to person, place, and time. No cranial nerve deficit. She exhibits normal muscle tone. Coordination normal.  No ataxia on finger to nose bilaterally. No pronator drift. 5/5 strength throughout. CN 2-12 intact.Equal grip strength. Sensation intact.   Skin: Skin is warm. Capillary refill takes less than 2 seconds.  Psychiatric: She has a normal mood and affect. Her behavior is normal.  Nursing note and vitals reviewed.    ED Treatments / Results  Labs (all labs ordered are listed, but only abnormal results are displayed) Labs Reviewed  BASIC METABOLIC PANEL - Abnormal; Notable for the following components:      Result Value   Glucose, Bld 130 (*)    GFR calc non Af Amer 57 (*)    All other components within normal limits  CK - Abnormal; Notable for the following components:   Total CK 390 (*)    All other components within normal limits  CBC WITH DIFFERENTIAL/PLATELET    EKG None  Radiology No results found.  Procedures Procedures (including critical care time)  Medications Ordered in ED Medications  acetaminophen (TYLENOL) tablet 650 mg (650 mg Oral Given 07/01/18 0256)     Initial Impression / Assessment and Plan / ED Course  I have reviewed the triage vital signs and the nursing notes.  Pertinent labs & imaging results that were available during my care of the patient were reviewed by me and considered in my medical decision making (see chart for details).    Left leg pain after working outside.  Neurovascularly intact.  Intact distal pulses, compartments soft.  No evidence of cord compression or cauda equina.  Suspect this  is muscular versus radicular in origin.  Labs are reassuring, CK minimally elevated.  Discussed p.o. hydration at home, muscle relaxers and anti-inflammatories.  Pain appears more to be muscular cramps rather than sciatica.  Patient declines trial of steroids at this time.  Advised to increase hydration at home, PCP follow-up.  Advised recheck of creatinine kinase later this week.  Return precautions discussed. Final Clinical Impressions(s) / ED Diagnoses   Final diagnoses:  Muscle cramps    ED Discharge Orders    None       Itsel Opfer, Jeannett Senior, MD 07/01/18 [phone]

## 2018-07-01 NOTE — Discharge Instructions (Signed)
Keep yourself hydrated.  Follow-up with your doctor for recheck this week.  You should have the blood test called CK rechecked to make sure it is improving.  You have declined trial of steroids today.  Return to the ED if develop new or worsening symptoms.

## 2018-07-07 ENCOUNTER — Other Ambulatory Visit: Payer: Self-pay | Admitting: Family Medicine

## 2018-07-08 ENCOUNTER — Encounter: Payer: Self-pay | Admitting: Family Medicine

## 2018-07-08 ENCOUNTER — Ambulatory Visit (INDEPENDENT_AMBULATORY_CARE_PROVIDER_SITE_OTHER): Payer: Medicare Other | Admitting: Family Medicine

## 2018-07-08 ENCOUNTER — Other Ambulatory Visit: Payer: Self-pay

## 2018-07-08 VITALS — BP 138/62 | HR 72 | Temp 98.1°F | Resp 16 | Ht 68.0 in | Wt 175.0 lb

## 2018-07-08 DIAGNOSIS — Z79899 Other long term (current) drug therapy: Secondary | ICD-10-CM | POA: Diagnosis not present

## 2018-07-08 DIAGNOSIS — Z09 Encounter for follow-up examination after completed treatment for conditions other than malignant neoplasm: Secondary | ICD-10-CM | POA: Diagnosis not present

## 2018-07-08 DIAGNOSIS — M5432 Sciatica, left side: Secondary | ICD-10-CM

## 2018-07-08 DIAGNOSIS — I10 Essential (primary) hypertension: Secondary | ICD-10-CM | POA: Diagnosis not present

## 2018-07-08 DIAGNOSIS — M5442 Lumbago with sciatica, left side: Secondary | ICD-10-CM | POA: Diagnosis not present

## 2018-07-08 DIAGNOSIS — R748 Abnormal levels of other serum enzymes: Secondary | ICD-10-CM | POA: Diagnosis not present

## 2018-07-08 DIAGNOSIS — M533 Sacrococcygeal disorders, not elsewhere classified: Secondary | ICD-10-CM | POA: Diagnosis not present

## 2018-07-08 DIAGNOSIS — Z7982 Long term (current) use of aspirin: Secondary | ICD-10-CM | POA: Insufficient documentation

## 2018-07-08 DIAGNOSIS — M79605 Pain in left leg: Secondary | ICD-10-CM | POA: Diagnosis not present

## 2018-07-08 LAB — BASIC METABOLIC PANEL WITH GFR
BUN / CREAT RATIO: 21 (calc) (ref 6–22)
BUN: 23 mg/dL (ref 7–25)
CHLORIDE: 101 mmol/L (ref 98–110)
CO2: 27 mmol/L (ref 20–32)
Calcium: 9.5 mg/dL (ref 8.6–10.4)
Creat: 1.12 mg/dL — ABNORMAL HIGH (ref 0.60–0.93)
GFR, Est African American: 58 mL/min/{1.73_m2} — ABNORMAL LOW (ref >=60)
GFR, Est Non African American: 50 mL/min/{1.73_m2} — ABNORMAL LOW (ref >=60)
GLUCOSE: 88 mg/dL (ref 65–99)
Potassium: 4.7 mmol/L (ref 3.5–5.3)
SODIUM: 137 mmol/L (ref 135–146)

## 2018-07-08 LAB — CK: Total CK: 140 U/L (ref 29–143)

## 2018-07-08 LAB — MAGNESIUM: Magnesium: 2.4 mg/dL (ref 1.5–2.5)

## 2018-07-08 MED ORDER — TRAMADOL HCL 50 MG PO TABS: 50.0000 mg | ORAL_TABLET | Freq: Three times a day (TID) | ORAL | 0 refills | Status: AC | PRN

## 2018-07-08 MED ORDER — PREDNISONE 20 MG PO TABS: 40.0000 mg | ORAL_TABLET | Freq: Every day | ORAL | 0 refills | Status: AC

## 2018-07-08 NOTE — Patient Instructions (Signed)
Piriformis stretches - look it up  Call me and let me know if it does not improve we will order physical therapy   Radicular Pain Radicular pain is a type of pain that spreads from your back or neck along a spinal nerve. Spinal nerves are nerves that leave the spinal cord and go to the muscles. Radicular pain occurs when one of these nerves becomes irritated or squeezed (compressed). Radicular pain is sometimes called radiculopathy, radiculitis, or a pinched nerve. When you have this type of pain, you may also have weakness, numbness, or tingling in the area of your body that is supplied by the nerve. The pain may feel sharp and burning. Spinal nerves leave the spinal cord through openings between the 24 bones (vertebrae) that make up the spine. Radicular pain is often caused by something pushing on a spinal nerve. This pushing may be done by a vertebra or by one of the round cushions between vertebrae (intervertebral disks). This can result from an injury, from wear and tear or aging of a disk, or from the growth of a bone spur that pushes on the nerve. Radicular pain can occur in various areas depending on which spinal nerve is affected:  Cervical radicular pain occurs in the neck. You may also feel pain, numbness, weakness, or tingling in the arms.  Thoracic radicular pain occurs in the mid-spine area. You would feel this pain in the back and chest. This type is rare.  Lumbar radicular pain occurs in the lower back area. You would feel this pain as low back pain. You may feel pain, numbness, weakness, or tingling in the buttocks or legs. Sciatica is a type of lumbar radicular pain that shoots down the back of the leg.  Radicular pain often goes away when you follow instructions from your health care provider for relieving pain at home. Follow these instructions at home: Managing pain  If directed, apply ice to the affected area: ? Put ice in a plastic bag. ? Place a towel between your skin  and the bag. ? Leave the ice on for 20 minutes, 2-3 times a day.  If directed, apply heat to the affected area as often as told by your health care provider. Use the heat source that your health care provider recommends, such as a moist heat pack or a heating pad. ? Place a towel between your skin and the heat source. ? Leave the heat on for 20-30 minutes. ? Remove the heat if your skin turns bright red. This is especially important if you are unable to feel pain, heat, or cold. You may have a greater risk of getting burned. Activity   Do not sit or rest in bed for long periods of time.  Try to stay as active as possible. Ask your health care provider what type of exercise or activity is best for you.  Avoid activities that make your pain worse, such as bending and lifting.  Do not lift anything that is heavier than 10 lb (4.5 kg). Practice using proper technique when lifting items. Proper lifting technique involves bending your knees and rising up.  Do strength and range-of-motion exercises only as told by your health care provider. General instructions  Take over-the-counter and prescription medicines only as told by your health care provider.  Pay attention to any changes in your symptoms.  Keep all follow-up visits as told by your health care provider. This is important. Contact a health care provider if:  Your pain and  other symptoms get worse.  Your pain medicine is not helping.  Your pain has not improved after a few weeks of home care.  You have a fever. Get help right away if:  You have severe pain, weakness, or numbness.  You have difficulty with bladder or bowel control. This information is not intended to replace advice given to you by your health care provider. Make sure you discuss any questions you have with your health care provider. Document Released: 11/21/2004 Document Revised: 03/21/2016 Document Reviewed: 05/10/2015 Elsevier Interactive Patient Education   2018 [employer].   Low Back Sprain Rehab Ask your health care provider which exercises are safe for you. Do exercises exactly as told by your health care provider and adjust them as directed. It is normal to feel mild stretching, pulling, tightness, or discomfort as you do these exercises, but you should stop right away if you feel sudden pain or your pain gets worse. Do not begin these exercises until told by your health care provider. Stretching and range of motion exercises These exercises warm up your muscles and joints and improve the movement and flexibility of your back. These exercises also help to relieve pain, numbness, and tingling. Exercise A: Lumbar rotation  1. Lie on your back on a firm surface and bend your knees. 2. Straighten your arms out to your sides so each arm forms an "L" shape with a side of your body (a 90 degree angle). 3. Slowly move both of your knees to one side of your body until you feel a stretch in your lower back. Try not to let your shoulders move off of the floor. 4. Hold for __________ seconds. 5. Tense your abdominal muscles and slowly move your knees back to the starting position. 6. Repeat this exercise on the other side of your body. Repeat __________ times. Complete this exercise __________ times a day. Exercise B: Prone extension on elbows  1. Lie on your abdomen on a firm surface. 2. Prop yourself up on your elbows. 3. Use your arms to help lift your chest up until you feel a gentle stretch in your abdomen and your lower back. ? This will place some of your body weight on your elbows. If this is uncomfortable, try stacking pillows under your chest. ? Your hips should stay down, against the surface that you are lying on. Keep your hip and back muscles relaxed. 4. Hold for __________ seconds. 5. Slowly relax your upper body and return to the starting position. Repeat __________ times. Complete this exercise __________ times a day. Strengthening  exercises These exercises build strength and endurance in your back. Endurance is the ability to use your muscles for a long time, even after they get tired. Exercise C: Pelvic tilt 1. Lie on your back on a firm surface. Bend your knees and keep your feet flat. 2. Tense your abdominal muscles. Tip your pelvis up toward the ceiling and flatten your lower back into the floor. ? To help with this exercise, you may place a small towel under your lower back and try to push your back into the towel. 3. Hold for __________ seconds. 4. Let your muscles relax completely before you repeat this exercise. Repeat __________ times. Complete this exercise __________ times a day. Exercise D: Alternating arm and leg raises  1. Get on your hands and knees on a firm surface. If you are on a hard floor, you may want to use padding to cushion your knees, such as an  exercise mat. 2. Line up your arms and legs. Your hands should be below your shoulders, and your knees should be below your hips. 3. Lift your left leg behind you. At the same time, raise your right arm and straighten it in front of you. ? Do not lift your leg higher than your hip. ? Do not lift your arm higher than your shoulder. ? Keep your abdominal and back muscles tight. ? Keep your hips facing the ground. ? Do not arch your back. ? Keep your balance carefully, and do not hold your breath. 4. Hold for __________ seconds. 5. Slowly return to the starting position and repeat with your right leg and your left arm. Repeat __________ times. Complete this exercise __________ times a day. Exercise E: Abdominal set with straight leg raise  1. Lie on your back on a firm surface. 2. Bend one of your knees and keep your other leg straight. 3. Tense your abdominal muscles and lift your straight leg up, 4-6 inches (10-15 cm) off the ground. 4. Keep your abdominal muscles tight and hold for __________ seconds. ? Do not hold your breath. ? Do not arch your  back. Keep it flat against the ground. 5. Keep your abdominal muscles tense as you slowly lower your leg back to the starting position. 6. Repeat with your other leg. Repeat __________ times. Complete this exercise __________ times a day. Posture and body mechanics  Body mechanics refers to the movements and positions of your body while you do your daily activities. Posture is part of body mechanics. Good posture and healthy body mechanics can help to relieve stress in your body's tissues and joints. Good posture means that your spine is in its natural S-curve position (your spine is neutral), your shoulders are pulled back slightly, and your head is not tipped forward. The following are general guidelines for applying improved posture and body mechanics to your everyday activities. Standing   When standing, keep your spine neutral and your feet about hip-width apart. Keep a slight bend in your knees. Your ears, shoulders, and hips should line up.  When you do a task in which you stand in one place for a long time, place one foot up on a stable object that is 2-4 inches (5-10 cm) high, such as a footstool. This helps keep your spine neutral. Sitting   When sitting, keep your spine neutral and keep your feet flat on the floor. Use a footrest, if necessary, and keep your thighs parallel to the floor. Avoid rounding your shoulders, and avoid tilting your head forward.  When working at a desk or a computer, keep your desk at a height where your hands are slightly lower than your elbows. Slide your chair under your desk so you are close enough to maintain good posture.  When working at a computer, place your monitor at a height where you are looking straight ahead and you do not have to tilt your head forward or downward to look at the screen. Resting   When lying down and resting, avoid positions that are most painful for you.  If you have pain with activities such as sitting, bending,  stooping, or squatting (flexion-based activities), lie in a position in which your body does not bend very much. For example, avoid curling up on your side with your arms and knees near your chest (fetal position).  If you have pain with activities such as standing for a long time or reaching with your arms (  extension-based activities), lie with your spine in a neutral position and bend your knees slightly. Try the following positions:  Lying on your side with a pillow between your knees.  Lying on your back with a pillow under your knees. Lifting   When lifting objects, keep your feet at least shoulder-width apart and tighten your abdominal muscles.  Bend your knees and hips and keep your spine neutral. It is important to lift using the strength of your legs, not your back. Do not lock your knees straight out.  Always ask for help to lift heavy or awkward objects. This information is not intended to replace advice given to you by your health care provider. Make sure you discuss any questions you have with your health care provider. Document Released: 10/14/2005 Document Revised: 06/20/2016 Document Reviewed: 07/26/2015 Elsevier Interactive Patient Education  [employer].

## 2018-07-08 NOTE — Progress Notes (Signed)
Patient ID: Tonya Pacheco, female    DOB: June 28, 1946, 72 y.o.   MRN: [id number]  PCP: Salley Scarlet, MD  Chief Complaint  Patient presents with  . ER F/U    muscle spasms  . Insomnia    Subjective:   Tonya Pacheco is a 72 y.o. female, presents to clinic with CC of low back and buttock pain with radiation to left leg, sx began a week ago leg pain worsening, she went to hospital with pain, shaking of left leg, found to have minimally elevated CK normal renal function.  She had hydrated prior to going to the ER and her symptoms were improving.  She was encouraged to hydrate and follow-up with PCP.  She states she has not had recurrence of any muscle spasms.  She has not noticed any darkening of her urine.  She has continued to drink more clear fluids than she was previously.  The day her symptoms began she was working outside in the heat in her yard for several hours, she has not done anything strenuous in the heat since then. She notes that her low back pain is minimally improved however the radiation of the sharp burning electrical pain to her left leg is worsening. Back Pain  This is a new problem. The current episode started in the past 7 days. The problem occurs constantly. The problem has been gradually worsening (low back pain better, SI joint and leg worse) since onset. The pain is present in the sacro-iliac and lumbar spine. The quality of the pain is described as shooting and aching (electrical and burning). The pain radiates to the left knee, left foot and left thigh. The pain is moderate. The pain is worse during the night. The symptoms are aggravated by lying down. Stiffness is present: no stiffness or muscle spasms. Associated symptoms include leg pain and paresthesias. Pertinent negatives include no abdominal pain, bladder incontinence, bowel incontinence, chest pain, dysuria, fever, headaches, numbness, paresis, pelvic pain, perianal numbness, tingling, weakness or weight loss. Risk  factors: no hx of CA, chronic steroid use, trauma. She has tried heat, home exercises, NSAIDs and muscle relaxant for the symptoms. The treatment provided no relief.     Patient Active Problem List   Diagnosis Date Noted  . Osteopenia 07/30/2016  . Hyperlipidemia 09/06/2015  . Essential hypertension, benign 03/06/2015  . Seasonal allergies 03/06/2015  . Cerumen impaction 03/06/2015     Prior to Admission medications   Medication Sig Start Date End Date Taking? Authorizing Provider  amLODipine (NORVASC) 10 MG tablet TAKE 1 TABLET ONCE DAILY 07/08/18  Yes Edroy, Velna Hatchet, MD  aspirin 81 MG tablet Take 81 mg by mouth daily.   Yes [provider]  methocarbamol (ROBAXIN) 500 MG tablet Take 1 tablet (500 mg total) by mouth every 8 (eight) hours as needed for muscle spasms. 07/01/18  Yes Rancour, Jeannett Senior, MD  multivitamin-iron-minerals-folic acid (CENTRUM) chewable tablet Chew 1 tablet by mouth daily.   Yes [provider]  naproxen (NAPROSYN) 500 MG tablet Take 1 tablet (500 mg total) by mouth every 12 (twelve) hours as needed for moderate pain. 07/01/18  Yes Rancour, Jeannett Senior, MD     Allergies  Allergen Reactions  . Penicillins   . Statins   . Zetia [Ezetimibe] Other (See Comments)    Eyes puffy, tongue swelling     Family History  Problem Relation Age of Onset  . Arthritis Sister 3       Rheumatoid Arthritis  .  Cancer Maternal Grandmother   . Heart disease Sister   . Lupus Sister   . Kidney disease Brother 54       Kidney transplant     Social History   Socioeconomic History  . Marital status: Widowed    Spouse name: Not on file  . Number of children: Not on file  . Years of education: Not on file  . Highest education level: Not on file  Occupational History  . Not on file  Social Needs  . Financial resource strain: Not on file  . Food insecurity:    Worry: Not on file    Inability: Not on file  . Transportation needs:    Medical: Not on file      Non-medical: Not on file  Tobacco Use  . Smoking status: Never Smoker  . Smokeless tobacco: Never Used  Substance and Sexual Activity  . Alcohol use: No  . Drug use: No  . Sexual activity: Not Currently  Lifestyle  . Physical activity:    Days per week: Not on file    Minutes per session: Not on file  . Stress: Not on file  Relationships  . Social connections:    Talks on phone: Not on file    Gets together: Not on file    Attends religious service: Not on file    Active member of club or organization: Not on file    Attends meetings of clubs or organizations: Not on file    Relationship status: Not on file  . Intimate partner violence:    Fear of current or ex partner: Not on file    Emotionally abused: Not on file    Physically abused: Not on file    Forced sexual activity: Not on file  Other Topics Concern  . Not on file  Social History Narrative  . Not on file     Review of Systems  Constitutional: Negative.  Negative for activity change, appetite change, chills, diaphoresis, fatigue, fever and weight loss.  HENT: Negative.   Respiratory: Negative.   Cardiovascular: Negative for chest pain.  Gastrointestinal: Negative for abdominal distention, abdominal pain, bowel incontinence, diarrhea, nausea and vomiting.  Genitourinary: Negative.  Negative for bladder incontinence, decreased urine volume, difficulty urinating, dyspareunia, dysuria, flank pain, frequency, genital sores, hematuria, menstrual problem, pelvic pain and urgency.       No saddle anesthesia  Musculoskeletal: Positive for back pain. Negative for arthralgias, gait problem, joint swelling, myalgias, neck pain and neck stiffness.  Skin: Negative.  Negative for color change, pallor and rash.  Neurological: Positive for paresthesias. Negative for dizziness, tingling, tremors, syncope, weakness, light-headedness, numbness and headaches.  Hematological: Negative.  Negative for adenopathy. Does not  bruise/bleed easily.  Psychiatric/Behavioral: Negative.   All other systems reviewed and are negative.      Objective:    Vitals:   07/08/18 1208  BP: 138/62  Pulse: 72  Resp: 16  Temp: 98.1 F (36.7 C)  TempSrc: Oral  SpO2: 96%  Weight: 175 lb (79.4 kg)  Height: 5\' 8"  (1.727 m)      Physical Exam  Constitutional: She is oriented to person, place, and time. She appears well-developed and well-nourished.  Non-toxic appearance. No distress.  HENT:  Head: Normocephalic and atraumatic.  Right Ear: External ear normal.  Left Ear: External ear normal.  Nose: Nose normal.  Mouth/Throat: Uvula is midline and mucous membranes are normal.  Eyes: Pupils are equal, round, and reactive to light. Conjunctivae and  lids are normal.  Neck: Normal range of motion and phonation normal. Neck supple. No tracheal deviation present.  Cardiovascular: Normal rate, regular rhythm, normal heart sounds, intact distal pulses and normal pulses. Exam reveals no gallop and no friction rub.  No murmur heard. Pulses:      Radial pulses are 2+ on the right side, and 2+ on the left side.       Posterior tibial pulses are 2+ on the right side, and 2+ on the left side.  No LE edema  Pulmonary/Chest: Effort normal and breath sounds normal. No stridor. No respiratory distress. She has no rhonchi.  Abdominal: Soft. Normal appearance and bowel sounds are normal. She exhibits no distension. There is no tenderness. There is no rebound and no guarding.  Musculoskeletal: Normal range of motion. She exhibits no edema or deformity.       Cervical back: Normal. She exhibits normal range of motion, no tenderness, no bony tenderness, no swelling, no edema and no spasm.       Thoracic back: Normal. She exhibits normal range of motion, no tenderness, no bony tenderness, no swelling, no edema, no deformity, no pain and no spasm.       Lumbar back: She exhibits normal range of motion, no tenderness, no bony tenderness, no  swelling, no edema, no pain and no spasm.  SI joint L ttp Normal ROM of back, b/l hips, knees, ankles  Lymphadenopathy:    She has no cervical adenopathy.  Neurological: She is alert and oriented to person, place, and time. She has normal strength. She displays no tremor. No sensory deficit. She exhibits normal muscle tone. Coordination and gait normal.  5/5 B/L with dorsiflexion and plantarflexion, extension at knees, flexion at hips Normal sensation to light touch to b/l LE Normal gait  Skin: Skin is warm, dry and intact. Capillary refill takes less than 2 seconds. No rash noted. She is not diaphoretic. No pallor.  Psychiatric: She has a normal mood and affect. Her speech is normal and behavior is normal. Judgment and thought content normal.  Nursing note and vitals reviewed.         Assessment & Plan:      ICD-10-CM   1. Elevated CK R74.8 BASIC METABOLIC PANEL WITH GFR    CK    Magnesium   was midly elevated, sx were brief and have resolved, recheck renal function, mag and CK  2. Sacroiliac pain M53.3 predniSONE (DELTASONE) 20 MG tablet    traMADol (ULTRAM) 50 MG tablet   left SI joint ttp, steroid burst, may need pt or imaging if not resolved  3. Sciatica of left side M54.32 predniSONE (DELTASONE) 20 MG tablet    traMADol (ULTRAM) 50 MG tablet   sciatica and parasthesias of left leg, tx with steroid burst, heat tx, freq repositioning, low back stretching, posture, lifting technique reviewed and printed  4. Encounter for examination following treatment at hospital Z09    All ER documents reviewed, meds reviewed. D/C robaxin, hold naproxen while doing steroids, hold ibuprofen until done with steroids     Danelle Berry, PA-C 07/08/18 12:29 PM

## 2018-07-09 ENCOUNTER — Emergency Department (HOSPITAL_COMMUNITY)
Admission: EM | Admit: 2018-07-09 | Discharge: 2018-07-09 | Disposition: A | Discharge status: Home / Self Care | Payer: Medicare Other | Attending: Emergency Medicine | Admitting: Emergency Medicine

## 2018-07-09 ENCOUNTER — Encounter (HOSPITAL_COMMUNITY): Payer: Self-pay | Admitting: Emergency Medicine

## 2018-07-09 ENCOUNTER — Other Ambulatory Visit: Payer: Self-pay

## 2018-07-09 DIAGNOSIS — M5432 Sciatica, left side: Secondary | ICD-10-CM

## 2018-07-09 MED ORDER — HYDROCODONE-ACETAMINOPHEN 5-325 MG PO TABS
ORAL_TABLET | ORAL | Status: AC
  Administered 2018-07-09: 2 via ORAL
  Filled 2018-07-09: qty 2

## 2018-07-09 MED ORDER — HYDROCODONE-ACETAMINOPHEN 5-325 MG PO TABS: 1.0000 | ORAL_TABLET | Freq: Four times a day (QID) | ORAL | 0 refills | Status: DC | PRN

## 2018-07-09 MED ORDER — HYDROCODONE-ACETAMINOPHEN 5-325 MG PO TABS
2.0000 | ORAL_TABLET | Freq: Once | ORAL | Status: AC
  Administered 2018-07-09: 2 via ORAL

## 2018-07-09 NOTE — ED Notes (Signed)
Patient discharged unable to sign due to system down time.

## 2018-07-09 NOTE — Discharge Instructions (Addendum)
Hydrocodone is prescribed as needed for pain.  Continue prednisone as previously prescribed.  Follow-up with your primary doctor if symptoms are not improving in the next week.

## 2018-07-09 NOTE — ED Triage Notes (Signed)
Pt C/O left leg pain from her left buttock to the the left foot. Pt states she was here 1 week ago with the same pain. Pt is taking Tramadol and prednisone with no relief.

## 2018-07-09 NOTE — ED Provider Notes (Signed)
[facility] EMERGENCY DEPARTMENT Provider Note   CSN: [id number] Arrival date & time: 07/08/18  2355     History   Chief Complaint Chief Complaint  Patient presents with  . Leg Pain    HPI Tonya Pacheco is a 72 y.o. female.  Patient is a 72 year old female with past medical history of hypertension, hyperlipidemia.  She presents for evaluation of pain in her left buttock that radiates down her left leg.  This is been present for the past week.  She was seen 1 week ago here with similar complaints, then again this afternoon by her primary doctor.  She was told she may have sciatica and was given prednisone and pain medication.  She took a dose of prednisone this evening and is now unable to sleep.  She denies any bowel or bladder complaints.  She denies any weakness or numbness.  The history is provided by the patient.  Leg Pain   This is a new problem. Episode onset: 1 week ago. The problem occurs constantly. The problem has been gradually worsening. Pain location: Left buttock. The quality of the pain is described as aching. The pain is severe. Pertinent negatives include no numbness, no stiffness and no tingling. Treatments tried: Prednisone and tramadol. The treatment provided no relief.    Past Medical History:  Diagnosis Date  . Hypertension     Patient Active Problem List   Diagnosis Date Noted  . Osteopenia 07/30/2016  . Hyperlipidemia 09/06/2015  . Essential hypertension, benign 03/06/2015  . Seasonal allergies 03/06/2015  . Cerumen impaction 03/06/2015    History reviewed. No pertinent surgical history.   OB History   None      Home Medications    Prior to Admission medications   Medication Sig Start Date End Date Taking? Authorizing Provider  amLODipine (NORVASC) 10 MG tablet TAKE 1 TABLET ONCE DAILY 07/08/18   Salley Scarlet, MD  aspirin 81 MG tablet Take 81 mg by mouth daily.    [provider]  HYDROcodone-acetaminophen (NORCO) 5-325 MG  tablet Take 1-2 tablets by mouth every 6 (six) hours as needed. 07/09/18   Geoffery Lyons, MD  methocarbamol (ROBAXIN) 500 MG tablet Take 1 tablet (500 mg total) by mouth every 8 (eight) hours as needed for muscle spasms. 07/01/18   Rancour, Jeannett Senior, MD  multivitamin-iron-minerals-folic acid (CENTRUM) chewable tablet Chew 1 tablet by mouth daily.    [provider]  naproxen (NAPROSYN) 500 MG tablet Take 1 tablet (500 mg total) by mouth every 12 (twelve) hours as needed for moderate pain. 07/01/18   Rancour, Jeannett Senior, MD  predniSONE (DELTASONE) 20 MG tablet Take 2 tablets (40 mg total) by mouth daily with breakfast for 5 days. 07/08/18 07/13/18  Danelle Berry, PA-C  traMADol (ULTRAM) 50 MG tablet Take 1 tablet (50 mg total) by mouth 3 (three) times daily as needed for up to 5 days for severe pain. 07/08/18 07/13/18  Danelle Berry, PA-C    Family History Family History  Problem Relation Age of Onset  . Arthritis Sister 53       Rheumatoid Arthritis  . Cancer Maternal Grandmother   . Heart disease Sister   . Lupus Sister   . Kidney disease Brother 40       Kidney transplant    Social History Social History   Tobacco Use  . Smoking status: Never Smoker  . Smokeless tobacco: Never Used  Substance Use Topics  . Alcohol use: No  . Drug use: No  Allergies   Penicillins; Statins; and Zetia [ezetimibe]   Review of Systems Review of Systems  Musculoskeletal: Negative for stiffness.  Neurological: Negative for tingling and numbness.  All other systems reviewed and are negative.    Physical Exam Updated Vital Signs BP (!) 174/62 (BP Location: Right Arm)   Pulse 86   Temp 98.1 F (36.7 C) (Oral)   Resp 17   SpO2 99%   Physical Exam  Constitutional: She is oriented to person, place, and time. She appears well-developed and well-nourished. No distress.  HENT:  Head: Normocephalic and atraumatic.  Neck: Normal range of motion. Neck supple.  Cardiovascular: Normal rate and  regular rhythm. Exam reveals no gallop and no friction rub.  No murmur heard. Pulmonary/Chest: Effort normal and breath sounds normal. No respiratory distress. She has no wheezes.  Abdominal: Soft. Bowel sounds are normal. She exhibits no distension. There is no tenderness.  Musculoskeletal: Normal range of motion.  There is tenderness to palpation in the posterior left buttock.  There is no palpable abnormality.  Neurological: She is alert and oriented to person, place, and time.  DTRs are trace and symmetrical in the patellar and Achilles tendons bilaterally.  Strength is 5 out of 5 in both lower she is able to ambulate on her heels and toes without difficulty.  Skin: Skin is warm and dry. She is not diaphoretic.  Nursing note and vitals reviewed.    ED Treatments / Results  Labs (all labs ordered are listed, but only abnormal results are displayed) Labs Reviewed - No data to display  EKG None  Radiology No results found.  Procedures Procedures (including critical care time)  Medications Ordered in ED Medications  HYDROcodone-acetaminophen (NORCO/VICODIN) 5-325 MG per tablet 2 tablet (2 tablets Oral Given 07/09/18 0115)     Initial Impression / Assessment and Plan / ED Course  I have reviewed the triage vital signs and the nursing notes.  Pertinent labs & imaging results that were available during my care of the patient were reviewed by me and considered in my medical decision making (see chart for details).  Patient appears to have sciatica.  She just started prednisone this evening and I believe this medication has not had time to work.  I have advised her to stay this course of action and will add a stronger pain medication as she is not getting much relief with the tramadol.  Patient inquired about an MRI, however I informed her that that is not available this evening, nor does it appear to be an emergent situation.  I have advised her to stay the course with the  previously prescribed medications and follow-up with primary doctor if not improving.  Final Clinical Impressions(s) / ED Diagnoses   Final diagnoses:  Sciatica of left side    ED Discharge Orders         Ordered    HYDROcodone-acetaminophen (NORCO) 5-325 MG tablet  Every 6 hours PRN     07/09/18 0100           Geoffery Lyonselo, Blayke Pinera, MD 07/09/18 [phone]

## 2018-12-07 ENCOUNTER — Other Ambulatory Visit: Payer: Self-pay | Admitting: Family Medicine

## 2019-01-05 ENCOUNTER — Other Ambulatory Visit: Payer: Self-pay

## 2019-01-05 ENCOUNTER — Encounter: Payer: Self-pay | Admitting: Family Medicine

## 2019-01-05 ENCOUNTER — Ambulatory Visit (INDEPENDENT_AMBULATORY_CARE_PROVIDER_SITE_OTHER): Payer: Medicare Other | Admitting: Family Medicine

## 2019-01-05 VITALS — BP 122/60 | HR 74 | Temp 98.5°F | Resp 16 | Ht 68.0 in | Wt 183.0 lb

## 2019-01-05 DIAGNOSIS — Z0000 Encounter for general adult medical examination without abnormal findings: Secondary | ICD-10-CM | POA: Diagnosis not present

## 2019-01-05 DIAGNOSIS — M8589 Other specified disorders of bone density and structure, multiple sites: Secondary | ICD-10-CM | POA: Diagnosis not present

## 2019-01-05 DIAGNOSIS — I10 Essential (primary) hypertension: Secondary | ICD-10-CM | POA: Diagnosis not present

## 2019-01-05 DIAGNOSIS — Z1239 Encounter for other screening for malignant neoplasm of breast: Secondary | ICD-10-CM

## 2019-01-05 LAB — LIPID PANEL
CHOL/HDL RATIO: 3.7 (calc) (ref <5.0)
CHOLESTEROL: 268 mg/dL — AB (ref <200)
HDL: 73 mg/dL (ref >=50)
LDL CHOLESTEROL (CALC): 175 mg/dL — AB
Non-HDL Cholesterol (Calc): 195 mg/dL (calc) — ABNORMAL HIGH (ref <130)
Triglycerides: 87 mg/dL (ref <150)

## 2019-01-05 LAB — COMPREHENSIVE METABOLIC PANEL
AG Ratio: 1.6 (calc) (ref 1.0–2.5)
ALBUMIN MSPROF: 4.3 g/dL (ref 3.6–5.1)
ALT: 12 U/L (ref 6–29)
AST: 18 U/L (ref 10–35)
Alkaline phosphatase (APISO): 66 U/L (ref 37–153)
BILIRUBIN TOTAL: 0.5 mg/dL (ref 0.2–1.2)
BUN/Creatinine Ratio: 16 (calc) (ref 6–22)
BUN: 16 mg/dL (ref 7–25)
CALCIUM: 9.6 mg/dL (ref 8.6–10.4)
CO2: 28 mmol/L (ref 20–32)
CREATININE: 0.97 mg/dL — AB (ref 0.60–0.93)
Chloride: 105 mmol/L (ref 98–110)
Globulin: 2.7 g/dL (calc) (ref 1.9–3.7)
Glucose, Bld: 100 mg/dL — ABNORMAL HIGH (ref 65–99)
POTASSIUM: 4.9 mmol/L (ref 3.5–5.3)
Sodium: 141 mmol/L (ref 135–146)
Total Protein: 7 g/dL (ref 6.1–8.1)

## 2019-01-05 LAB — CBC WITH DIFFERENTIAL/PLATELET
ABSOLUTE MONOCYTES: 370 {cells}/uL (ref 200–950)
BASOS ABS: 20 {cells}/uL (ref 0–200)
Basophils Relative: 0.4 %
EOS ABS: 100 {cells}/uL (ref 15–500)
Eosinophils Relative: 2 %
HEMATOCRIT: 39.8 % (ref 35.0–45.0)
Hemoglobin: 13 g/dL (ref 11.7–15.5)
LYMPHS ABS: 1900 {cells}/uL (ref 850–3900)
MCH: 30.2 pg (ref 27.0–33.0)
MCHC: 32.7 g/dL (ref 32.0–36.0)
MCV: 92.6 fL (ref 80.0–100.0)
MPV: 10.8 fL (ref 7.5–12.5)
Monocytes Relative: 7.4 %
Neutro Abs: 2610 cells/uL (ref 1500–7800)
Neutrophils Relative %: 52.2 %
PLATELETS: 228 10*3/uL (ref 140–400)
RBC: 4.3 10*6/uL (ref 3.80–5.10)
RDW: 12.8 % (ref 11.0–15.0)
Total Lymphocyte: 38 %
WBC: 5 10*3/uL (ref 3.8–10.8)

## 2019-01-05 NOTE — Progress Notes (Signed)
Subjective:   Patient presents for Medicare Annual/Subsequent preventive examination.   Pt here for CPE  HTN- taking norvasc  5mg    Oteopenia- taking calcium and vitamin D she stays active in her garden weightbearing   No concerns     Review Past Medical/Family/Social: Per EMR   Risk Factors  Current exercise habits: walks  Dietary issues discussed: No concerns   Cardiac risk factors: HTN  Depression Screen  (Note: if answer to either of the following is "Yes", a more complete depression screening is indicated)  Over the past two weeks, have you felt down, depressed or hopeless? No Over the past two weeks, have you felt little interest or pleasure in doing things? No Have you lost interest or pleasure in daily life? No Do you often feel hopeless? No Do you cry easily over simple problems? No   Activities of Daily Living  In your present state of health, do you have any difficulty performing the following activities?:  Driving? No  Managing money? No  Feeding yourself? No  Getting from bed to chair? No  Climbing a flight of stairs? No  Preparing food and eating?: No  Bathing or showering? No  Getting dressed: No  Getting to the toilet? No  Using the toilet:No  Moving around from place to place: No  In the past year have you fallen or had a near fall?:No  Are you sexually active? No  Do you have more than one partner? No   Hearing Difficulties: No  Do you often ask people to speak up or repeat themselves? No  Do you experience ringing or noises in your ears? No Do you have difficulty understanding soft or whispered voices? No  Do you feel that you have a problem with memory? No Do you often misplace items? No  Do you feel safe at home? Yes  Cognitive Testing  Alert? Yes Normal Appearance?Yes  Oriented to person? Yes Place? Yes  Time? Yes  Recall of three objects? Yes  Can perform simple calculations? Yes  Displays appropriate judgment?Yes  Can read the  correct time from a watch face?Yes   List the Names of Other Physician/Practitioners you currently use:    IScreening Tests / Date ColonoscopyUTD per pt ZostavaxDeclines MammogramUTD Bone Density- UTD Influenza VaccineDeclines Tetanus/tdapUTD Pneumonia-UTD  ROS: GEN- denies fatigue, fever, weight loss,weakness, recent illness HEENT- denies eye drainage, change in vision, nasal discharge, CVS- denies chest pain, palpitations RESP- denies SOB, cough, wheeze ABD- denies N/V, change in stools, abd pain GU- denies dysuria, hematuria, dribbling, incontinence MSK-+joint pain, muscle aches, injury Neuro- denies headache, dizziness, syncope, seizure activity  PHYSICAL: GEN- NAD, alert and oriented x3 HEENT- PERRL, EOMI, non injected sclera, pink conjunctiva, MMM, oropharynx clear, Bilat canals impacted with wax, hearing in tact Neck- Supple, no thryomegaly,no bruits CVS- RRR,3/6 SEM RESP-CTAB ABD-NABS,soft,NT,ND Psych- normal affect and mood EXT- No edema  Assessment:    Annual wellness medicare exam   Plan:    During the course of the visit the patient was educated and counseled about appropriate screening and preventive services including:  Screening mammography - pt to schedule for next month  Declines flu and shingles vaccine  Screen Negative  for depression.  Fall/Audit C screening negative    Fasting labs to be done  Hypertension blood pressure well controlled Osteopenia continue calcium and vitamin D we will recheck bone density in 2021  Patient is full code she does have will her daughter is her healthcare power of attorney when  this comes supply  He has some wax impaction but she prefers to clean this out at home using a peroxide water rinse  Diet review for nutrition referral? Yes ____ Not Indicated __x__  Patient Instructions (the written plan) was given to the patient.  Medicare Attestation  I have personally reviewed:   The patient's medical and social history  Their use of alcohol, tobacco or illicit drugs  Their current medications and supplements  The patient's functional ability including ADLs,fall risks, home safety risks, cognitive, and hearing and visual impairment  Diet and physical activities  Evidence for depression or mood disorders  The patient's weight, height, BMI, and visual acuity have been recorded in the chart. I have made referrals, counseling, and provided education to the patient based on review of the above and I have provided the patient with a written personalized care plan for preventive services.

## 2019-01-05 NOTE — Patient Instructions (Signed)
Schedule Mammogram in mid April or after We will call with lab results  No change to blood pressure medication  F/U 1 year as physical

## 2019-03-29 ENCOUNTER — Other Ambulatory Visit (HOSPITAL_COMMUNITY): Payer: Self-pay | Admitting: Family Medicine

## 2019-03-29 DIAGNOSIS — Z1231 Encounter for screening mammogram for malignant neoplasm of breast: Secondary | ICD-10-CM

## 2019-07-15 ENCOUNTER — Other Ambulatory Visit: Payer: Self-pay | Admitting: Family Medicine

## 2019-10-12 ENCOUNTER — Other Ambulatory Visit: Payer: Self-pay | Admitting: *Deleted

## 2019-10-12 MED ORDER — AMLODIPINE BESYLATE 10 MG PO TABS: 10.0000 mg | ORAL_TABLET | Freq: Every day | ORAL | 3 refills | Status: DC

## 2020-01-07 ENCOUNTER — Encounter: Payer: Medicare Other | Admitting: Family Medicine

## 2020-01-10 ENCOUNTER — Encounter: Payer: Medicare Other | Admitting: Family Medicine

## 2020-03-29 ENCOUNTER — Encounter: Payer: Self-pay | Admitting: Family Medicine

## 2020-03-29 ENCOUNTER — Other Ambulatory Visit: Payer: Self-pay

## 2020-03-29 ENCOUNTER — Ambulatory Visit (INDEPENDENT_AMBULATORY_CARE_PROVIDER_SITE_OTHER): Payer: Medicare Other | Admitting: Family Medicine

## 2020-03-29 VITALS — BP 128/82 | HR 72 | Temp 98.6°F | Resp 14 | Ht 68.0 in | Wt 181.0 lb

## 2020-03-29 DIAGNOSIS — M8589 Other specified disorders of bone density and structure, multiple sites: Secondary | ICD-10-CM

## 2020-03-29 DIAGNOSIS — T466X5A Adverse effect of antihyperlipidemic and antiarteriosclerotic drugs, initial encounter: Secondary | ICD-10-CM

## 2020-03-29 DIAGNOSIS — Z0001 Encounter for general adult medical examination with abnormal findings: Secondary | ICD-10-CM

## 2020-03-29 DIAGNOSIS — E7800 Pure hypercholesterolemia, unspecified: Secondary | ICD-10-CM

## 2020-03-29 DIAGNOSIS — Z1211 Encounter for screening for malignant neoplasm of colon: Secondary | ICD-10-CM

## 2020-03-29 DIAGNOSIS — I10 Essential (primary) hypertension: Secondary | ICD-10-CM | POA: Diagnosis not present

## 2020-03-29 DIAGNOSIS — M609 Myositis, unspecified: Secondary | ICD-10-CM

## 2020-03-29 DIAGNOSIS — Z1231 Encounter for screening mammogram for malignant neoplasm of breast: Secondary | ICD-10-CM

## 2020-03-29 DIAGNOSIS — Z0000 Encounter for general adult medical examination without abnormal findings: Secondary | ICD-10-CM

## 2020-03-29 LAB — COMPREHENSIVE METABOLIC PANEL
AG Ratio: 1.5 (calc) (ref 1.0–2.5)
ALT: 12 U/L (ref 6–29)
AST: 16 U/L (ref 10–35)
Albumin: 4.2 g/dL (ref 3.6–5.1)
Alkaline phosphatase (APISO): 78 U/L (ref 37–153)
BUN/Creatinine Ratio: 16 (calc) (ref 6–22)
BUN: 17 mg/dL (ref 7–25)
CO2: 27 mmol/L (ref 20–32)
Calcium: 9.5 mg/dL (ref 8.6–10.4)
Chloride: 104 mmol/L (ref 98–110)
Creat: 1.07 mg/dL — ABNORMAL HIGH (ref 0.60–0.93)
Globulin: 2.8 g/dL (calc) (ref 1.9–3.7)
Glucose, Bld: 106 mg/dL — ABNORMAL HIGH (ref 65–99)
Potassium: 4.5 mmol/L (ref 3.5–5.3)
Sodium: 141 mmol/L (ref 135–146)
Total Bilirubin: 0.5 mg/dL (ref 0.2–1.2)
Total Protein: 7 g/dL (ref 6.1–8.1)

## 2020-03-29 LAB — CBC WITH DIFFERENTIAL/PLATELET
Absolute Monocytes: 410 cells/uL (ref 200–950)
Basophils Absolute: 20 cells/uL (ref 0–200)
Basophils Relative: 0.4 %
Eosinophils Absolute: 150 cells/uL (ref 15–500)
Eosinophils Relative: 3 %
HCT: 40.4 % (ref 35.0–45.0)
Hemoglobin: 13.7 g/dL (ref 11.7–15.5)
Lymphs Abs: 2155 cells/uL (ref 850–3900)
MCH: 32.2 pg (ref 27.0–33.0)
MCHC: 33.9 g/dL (ref 32.0–36.0)
MCV: 94.8 fL (ref 80.0–100.0)
MPV: 10.5 fL (ref 7.5–12.5)
Monocytes Relative: 8.2 %
Neutro Abs: 2265 cells/uL (ref 1500–7800)
Neutrophils Relative %: 45.3 %
Platelets: 232 10*3/uL (ref 140–400)
RBC: 4.26 10*6/uL (ref 3.80–5.10)
RDW: 12.7 % (ref 11.0–15.0)
Total Lymphocyte: 43.1 %
WBC: 5 10*3/uL (ref 3.8–10.8)

## 2020-03-29 LAB — LIPID PANEL
Cholesterol: 289 mg/dL — ABNORMAL HIGH (ref <200)
HDL: 70 mg/dL (ref >=50)
LDL Cholesterol (Calc): 201 mg/dL (calc) — ABNORMAL HIGH
Non-HDL Cholesterol (Calc): 219 mg/dL (calc) — ABNORMAL HIGH (ref <130)
Total CHOL/HDL Ratio: 4.1 (calc) (ref <5.0)
Triglycerides: 75 mg/dL (ref <150)

## 2020-03-29 NOTE — Patient Instructions (Addendum)
Referral to GI for colonoscopy Schedule mammogram and Bone Density  F/U 1 year for physical

## 2020-03-29 NOTE — Progress Notes (Signed)
Subjective:   Patient presents for Medicare Annual/Subsequent preventive examination.  Pt here for CPE  HTN- taking norvasc  5mg  , no SE, bp has been well controlled    Oteopenia- taking calcium and vitamin D she stays active in her garden weightbearing   No concerns     Review Past Medical/Family/Social: Per EMR   Risk Factors  Current exercise habits: walks  Dietary issues discussed: No concerns   Cardiac risk factors: HTN  Depression Screen  (Note: if answer to either of the following is "Yes", a more complete depression screening is indicated)  Over the past two weeks, have you felt down, depressed or hopeless? No Over the past two weeks, have you felt little interest or pleasure in doing things? No Have you lost interest or pleasure in daily life? No Do you often feel hopeless? No Do you cry easily over simple problems? No   Activities of Daily Living  In your present state of health, do you have any difficulty performing the following activities?:  Driving? No  Managing money? No  Feeding yourself? No  Getting from bed to chair? No  Climbing a flight of stairs? No  Preparing food and eating?: No  Bathing or showering? No  Getting dressed: No  Getting to the toilet? No  Using the toilet:No  Moving around from place to place: No  In the past year have you fallen or had a near fall?:No  Are you sexually active? No  Do you have more than one partner? No   Hearing Difficulties: No  Do you often ask people to speak up or repeat themselves? No  Do you experience ringing or noises in your ears? No Do you have difficulty understanding soft or whispered voices? No  Do you feel that you have a problem with memory? No Do you often misplace items? No  Do you feel safe at home? Yes  Cognitive Testing  Alert? Yes Normal Appearance?Yes  Oriented to person? Yes Place? Yes  Time? Yes  Recall of three objects? Yes  Can perform simple calculations? Yes  Displays  appropriate judgment?Yes  Can read the correct time from a watch face?Yes   List the Names of Other Physician/Practitioners you currently use:  Optometry- [facility]- next appt Sept 16th Dentist- June 15th   IScreening Tests / Date ColonoscopyUTD per pt ZostavaxDeclines MammogramUTD Bone Density-  2019 OSTEP[place]  Influenza VaccineDeclines Tetanus/tdapUTD Pneumonia-UTD COVID-19 UTD   ROS: GEN- denies fatigue, fever, weight loss,weakness, recent illness HEENT- denies eye drainage, change in vision, nasal discharge, CVS- denies chest pain, palpitations RESP- denies SOB, cough, wheeze ABD- denies N/V, change in stools, abd pain GU- denies dysuria, hematuria, dribbling, incontinence MSK-Denies joint pain, muscle aches, injury Neuro- denies headache, dizziness, syncope, seizure activity  PHYSICAL: GEN- NAD, alert and oriented x3 HEENT- PERRL, EOMI, non injected sclera, pink conjunctiva, MMM, oropharynx clear, Bilat canals impacted with wax, hearing in tact Neck- Supple, no thryomegaly,no bruits CVS- RRR,3/6 SEM RESP-CTAB ABD-NABS,soft,NT,ND Psych- normal affect and mood EXT- No edema  Assessment:    Annual wellness medicare exam   Plan:    During the course of the visit the patient was educated and counseled about appropriate screening and preventive services including:  Screening mammography - pt to schedule   Colonoscopy referral placed   Declines flu and shingles vaccine  Depression/ Fall/Audit C screening negative   Osteopenia- due for bone density pt to schedule  , continue calcium and D    Fasting labs  to be done  Hypertension blood pressure well controlled  Hyperlipidemia- diet controlled as much as possible due to intolerance with statins and zetia   Patient is full code she does have will her daughter who her healthcare power of attorney      Diet review for nutrition referral? Yes ____ Not  Indicated __x__  Patient Instructions (the written plan) was given to the patient.  Medicare Attestation  I have personally reviewed:  The patient's medical and social history  Their use of alcohol, tobacco or illicit drugs  Their current medications and supplements  The patient's functional ability including ADLs,fall risks, home safety risks, cognitive, and hearing and visual impairment  Diet and physical activities  Evidence for depression or mood disorders  The patient's weight, height, BMI, and visual acuity have been recorded in the chart. I have made referrals, counseling, and provided education to the patient based on review of the above and I have provided the patient with a written personalized care plan for preventive services.

## 2020-03-30 ENCOUNTER — Encounter (INDEPENDENT_AMBULATORY_CARE_PROVIDER_SITE_OTHER): Payer: Self-pay | Admitting: *Deleted

## 2020-07-13 DIAGNOSIS — H43813 Vitreous degeneration, bilateral: Secondary | ICD-10-CM | POA: Diagnosis not present

## 2020-08-02 ENCOUNTER — Ambulatory Visit (HOSPITAL_COMMUNITY)
Admission: RE | Admit: 2020-08-02 | Discharge: 2020-08-02 | Disposition: A | Discharge status: Home / Self Care | Payer: Medicare Other | Source: Ambulatory Visit | Attending: Family Medicine | Admitting: Family Medicine

## 2020-08-02 ENCOUNTER — Other Ambulatory Visit: Payer: Self-pay

## 2020-08-02 ENCOUNTER — Other Ambulatory Visit (INDEPENDENT_AMBULATORY_CARE_PROVIDER_SITE_OTHER): Payer: Self-pay

## 2020-08-02 ENCOUNTER — Encounter: Payer: Self-pay | Admitting: *Deleted

## 2020-08-02 DIAGNOSIS — Z780 Asymptomatic menopausal state: Secondary | ICD-10-CM | POA: Diagnosis not present

## 2020-08-02 DIAGNOSIS — Z1211 Encounter for screening for malignant neoplasm of colon: Secondary | ICD-10-CM

## 2020-08-02 DIAGNOSIS — M8589 Other specified disorders of bone density and structure, multiple sites: Secondary | ICD-10-CM | POA: Insufficient documentation

## 2020-08-02 DIAGNOSIS — Z1231 Encounter for screening mammogram for malignant neoplasm of breast: Secondary | ICD-10-CM | POA: Diagnosis not present

## 2020-08-02 DIAGNOSIS — R29890 Loss of height: Secondary | ICD-10-CM | POA: Diagnosis not present

## 2020-08-07 ENCOUNTER — Other Ambulatory Visit (HOSPITAL_COMMUNITY): Payer: Self-pay | Admitting: Family Medicine

## 2020-08-07 DIAGNOSIS — R928 Other abnormal and inconclusive findings on diagnostic imaging of breast: Secondary | ICD-10-CM

## 2020-08-15 ENCOUNTER — Encounter (INDEPENDENT_AMBULATORY_CARE_PROVIDER_SITE_OTHER): Payer: Self-pay

## 2020-08-15 ENCOUNTER — Telehealth (INDEPENDENT_AMBULATORY_CARE_PROVIDER_SITE_OTHER): Payer: Self-pay

## 2020-08-15 DIAGNOSIS — Z1211 Encounter for screening for malignant neoplasm of colon: Secondary | ICD-10-CM

## 2020-08-15 MED ORDER — PLENVU 140 G PO SOLR: 1.0000 | ORAL | 0 refills | Status: DC

## 2020-08-15 NOTE — Telephone Encounter (Signed)
Ok to schedule.  Thanks,  Tanicka Bisaillon Castaneda Mayorga, MD Gastroenterology and Hepatology [facility]  

## 2020-08-15 NOTE — Telephone Encounter (Signed)
Patient has Plenvu (copay card) 

## 2020-08-15 NOTE — Telephone Encounter (Signed)
Referring MD/PCP: [facility]   Procedure: Tcs  Reason/Indication:  Screening  Has patient had this procedure before?  Yes, 10 yrs ago  If so, when, by whom and where?    Is there a family history of colon cancer?  no  Who?  What age when diagnosed?    Is patient diabetic?   no      Does patient have prosthetic heart valve or mechanical valve?  no  Do you have a pacemaker/defibrillator?  no  Has patient ever had endocarditis/atrial fibrillation? no  Does patient use oxygen? no  Has patient had joint replacement within last 12 months?  no  Is patient constipated or do they take laxatives? no  Does patient have a history of alcohol/drug use?  no  Is patient on blood thinner such as Coumadin, Plavix and/or Aspirin? yes  Medications: Aspirin 81mg  daily, Amlodipine 5 mg daily  Allergies: pcn  Medication Adjustment per Dr no  Procedure date & time: 09/15/20 9:15

## 2020-08-22 ENCOUNTER — Other Ambulatory Visit: Payer: Self-pay

## 2020-08-22 ENCOUNTER — Ambulatory Visit (HOSPITAL_COMMUNITY)
Admission: RE | Admit: 2020-08-22 | Discharge: 2020-08-22 | Disposition: A | Discharge status: Home / Self Care | Payer: Medicare Other | Source: Ambulatory Visit | Attending: Family Medicine | Admitting: Family Medicine

## 2020-08-22 DIAGNOSIS — R928 Other abnormal and inconclusive findings on diagnostic imaging of breast: Secondary | ICD-10-CM | POA: Diagnosis not present

## 2020-08-28 ENCOUNTER — Other Ambulatory Visit (HOSPITAL_COMMUNITY): Payer: Medicare Other

## 2020-09-05 ENCOUNTER — Other Ambulatory Visit (HOSPITAL_COMMUNITY): Payer: Medicare Other

## 2020-09-05 ENCOUNTER — Encounter (HOSPITAL_COMMUNITY): Payer: Medicare Other

## 2020-09-08 ENCOUNTER — Telehealth: Payer: Self-pay | Admitting: *Deleted

## 2020-09-08 NOTE — Telephone Encounter (Signed)
Received call from patient.   Reports that she has colonoscopy scheduled on 09/15/2020. Inquired as to when she should stop ASA.   MD please advise.

## 2020-09-11 NOTE — Telephone Encounter (Signed)
She can  safely stop the Aspirin 5 days before, but she needs to call her GI and verify if that is okay with them  It should have been stated in the colonoscopy instructions letter

## 2020-09-11 NOTE — Telephone Encounter (Signed)
Call placed to patient and patient made aware.  

## 2020-09-12 NOTE — Patient Instructions (Signed)
Tonya Pacheco  09/12/2020     @PREFPERIOPPHARMACY @   Your procedure is scheduled on  09/15/2020.  Report to 09/17/2020 at  0745  A.M.  Call this number if you have problems the morning of surgery:  [phone]   Remember:  Follow the diet and prep instructions given to you by the office.                      Take these medicines the morning of surgery with A SIP OF WATER  Amlodipine.    Do not wear jewelry, make-up or nail polish.  Do not wear lotions, powders, or perfumes.Please wear deodorant and brush your teeth.  Do not shave 48 hours prior to surgery.  Men may shave face and neck.  Do not bring valuables to the hospital.  [facility] is not responsible for any belongings or valuables.  Contacts, dentures or bridgework may not be worn into surgery.  Leave your suitcase in the car.  After surgery it may be brought to your room.  For patients admitted to the hospital, discharge time will be determined by your treatment team.  Patients discharged the day of surgery will not be allowed to drive home.   Name and phone number of your driver:   family Special instructions:  DO NOT smoke the morning of your procedure.  Please read over the following fact sheets that you were given. Anesthesia Post-op Instructions and Care and Recovery After Surgery       Colonoscopy, Adult, Care After This sheet gives you information about how to care for yourself after your procedure. Your health care provider may also give you more specific instructions. If you have problems or questions, contact your health care provider. What can I expect after the procedure? After the procedure, it is common to have:  A small amount of blood in your stool for 24 hours after the procedure.  Some gas.  Mild cramping or bloating of your abdomen. Follow these instructions at home: Eating and drinking   Drink enough fluid to keep your urine pale yellow.  Follow instructions from your health  care provider about eating or drinking restrictions.  Resume your normal diet as instructed by your health care provider. Avoid heavy or fried foods that are hard to digest. Activity  Rest as told by your health care provider.  Avoid sitting for a long time without moving. Get up to take short walks every 1-2 hours. This is important to improve blood flow and breathing. Ask for help if you feel weak or unsteady.  Return to your normal activities as told by your health care provider. Ask your health care provider what activities are safe for you. Managing cramping and bloating   Try walking around when you have cramps or feel bloated.  Apply heat to your abdomen as told by your health care provider. Use the heat source that your health care provider recommends, such as a moist heat pack or a heating pad. ? Place a towel between your skin and the heat source. ? Leave the heat on for 20-30 minutes. ? Remove the heat if your skin turns bright red. This is especially important if you are unable to feel pain, heat, or cold. You may have a greater risk of getting burned. General instructions  For the first 24 hours after the procedure: ? Do not drive or use machinery. ? Do not sign important documents. ? Do  not drink alcohol. ? Do your regular daily activities at a slower pace than normal. ? Eat soft foods that are easy to digest.  Take over-the-counter and prescription medicines only as told by your health care provider.  Keep all follow-up visits as told by your health care provider. This is important. Contact a health care provider if:  You have blood in your stool 2-3 days after the procedure. Get help right away if you have:  More than a small spotting of blood in your stool.  Large blood clots in your stool.  Swelling of your abdomen.  Nausea or vomiting.  A fever.  Increasing pain in your abdomen that is not relieved with medicine. Summary  After the procedure, it is  common to have a small amount of blood in your stool. You may also have mild cramping and bloating of your abdomen.  For the first 24 hours after the procedure, do not drive or use machinery, sign important documents, or drink alcohol.  Get help right away if you have a lot of blood in your stool, nausea or vomiting, a fever, or increased pain in your abdomen. This information is not intended to replace advice given to you by your health care provider. Make sure you discuss any questions you have with your health care provider. Document Revised: 05/10/2019 Document Reviewed: 05/10/2019 Elsevier Patient Education  [place] After These instructions provide you with information about caring for yourself after your procedure. Your health care provider may also give you more specific instructions. Your treatment has been planned according to current medical practices, but problems sometimes occur. Call your health care provider if you have any problems or questions after your procedure. What can I expect after the procedure? After your procedure, you may:  Feel sleepy for several hours.  Feel clumsy and have poor balance for several hours.  Feel forgetful about what happened after the procedure.  Have poor judgment for several hours.  Feel nauseous or vomit.  Have a sore throat if you had a breathing tube during the procedure. Follow these instructions at home: For at least 24 hours after the procedure:      Have a responsible adult stay with you. It is important to have someone help care for you until you are awake and alert.  Rest as needed.  Do not: ? Participate in activities in which you could fall or become injured. ? Drive. ? Use heavy machinery. ? Drink alcohol. ? Take sleeping pills or medicines that cause drowsiness. ? Make important decisions or sign legal documents. ? Take care of children on your own. Eating and  drinking  Follow the diet that is recommended by your health care provider.  If you vomit, drink water, juice, or soup when you can drink without vomiting.  Make sure you have little or no nausea before eating solid foods. General instructions  Take over-the-counter and prescription medicines only as told by your health care provider.  If you have sleep apnea, surgery and certain medicines can increase your risk for breathing problems. Follow instructions from your health care provider about wearing your sleep device: ? Anytime you are sleeping, including during daytime naps. ? While taking prescription pain medicines, sleeping medicines, or medicines that make you drowsy.  If you smoke, do not smoke without supervision.  Keep all follow-up visits as told by your health care provider. This is important. Contact a health care provider if:  You keep feeling  nauseous or you keep vomiting.  You feel light-headed.  You develop a rash.  You have a fever. Get help right away if:  You have trouble breathing. Summary  For several hours after your procedure, you may feel sleepy and have poor judgment.  Have a responsible adult stay with you for at least 24 hours or until you are awake and alert. This information is not intended to replace advice given to you by your health care provider. Make sure you discuss any questions you have with your health care provider. Document Revised: 01/12/2018 Document Reviewed: 02/04/2016 Elsevier Patient Education  [place].

## 2020-09-13 ENCOUNTER — Other Ambulatory Visit: Payer: Self-pay

## 2020-09-13 ENCOUNTER — Other Ambulatory Visit (HOSPITAL_COMMUNITY)
Admission: RE | Admit: 2020-09-13 | Discharge: 2020-09-13 | Disposition: A | Discharge status: Home / Self Care | Payer: Medicare Other | Source: Ambulatory Visit | Attending: Gastroenterology | Admitting: Gastroenterology

## 2020-09-13 ENCOUNTER — Other Ambulatory Visit (HOSPITAL_COMMUNITY): Payer: Medicare Other

## 2020-09-13 ENCOUNTER — Encounter (HOSPITAL_COMMUNITY)
Admission: RE | Admit: 2020-09-13 | Discharge: 2020-09-13 | Disposition: A | Discharge status: Home / Self Care | Payer: Medicare Other | Source: Ambulatory Visit | Attending: Gastroenterology | Admitting: Gastroenterology

## 2020-09-13 DIAGNOSIS — Z20822 Contact with and (suspected) exposure to covid-19: Secondary | ICD-10-CM | POA: Insufficient documentation

## 2020-09-13 DIAGNOSIS — Z01812 Encounter for preprocedural laboratory examination: Secondary | ICD-10-CM | POA: Insufficient documentation

## 2020-09-13 LAB — SARS CORONAVIRUS 2 (TAT 6-24 HRS): SARS Coronavirus 2: NEGATIVE

## 2020-09-15 ENCOUNTER — Encounter (HOSPITAL_COMMUNITY)
Admission: RE | Disposition: A | Discharge status: Home / Self Care | Payer: Self-pay | Source: Home / Self Care | Attending: Gastroenterology

## 2020-09-15 ENCOUNTER — Ambulatory Visit (HOSPITAL_COMMUNITY): Payer: Medicare Other | Admitting: Anesthesiology

## 2020-09-15 ENCOUNTER — Encounter (HOSPITAL_COMMUNITY): Payer: Self-pay | Admitting: Gastroenterology

## 2020-09-15 ENCOUNTER — Ambulatory Visit (HOSPITAL_COMMUNITY)
Admission: RE | Admit: 2020-09-15 | Discharge: 2020-09-15 | Disposition: A | Discharge status: Home / Self Care | Payer: Medicare Other | Attending: Gastroenterology | Admitting: Gastroenterology

## 2020-09-15 ENCOUNTER — Other Ambulatory Visit: Payer: Self-pay

## 2020-09-15 DIAGNOSIS — Z1211 Encounter for screening for malignant neoplasm of colon: Secondary | ICD-10-CM | POA: Insufficient documentation

## 2020-09-15 DIAGNOSIS — I10 Essential (primary) hypertension: Secondary | ICD-10-CM | POA: Insufficient documentation

## 2020-09-15 HISTORY — PX: COLONOSCOPY WITH PROPOFOL: SHX5780

## 2020-09-15 SURGERY — COLONOSCOPY WITH PROPOFOL
Anesthesia: General

## 2020-09-15 MED ORDER — PROPOFOL 500 MG/50ML IV EMUL
INTRAVENOUS | Status: DC | PRN
  Administered 2020-09-15: 100 ug/kg/min via INTRAVENOUS

## 2020-09-15 MED ORDER — LIDOCAINE HCL (CARDIAC) PF 100 MG/5ML IV SOSY
PREFILLED_SYRINGE | INTRAVENOUS | Status: DC | PRN
  Administered 2020-09-15: 50 mg via INTRAVENOUS

## 2020-09-15 MED ORDER — CHLORHEXIDINE GLUCONATE CLOTH 2 % EX PADS: 6.0000 | MEDICATED_PAD | Freq: Once | CUTANEOUS | Status: DC

## 2020-09-15 MED ORDER — LACTATED RINGERS IV SOLN: INTRAVENOUS | Status: DC | PRN

## 2020-09-15 MED ORDER — PROPOFOL 10 MG/ML IV BOLUS
INTRAVENOUS | Status: DC | PRN
  Administered 2020-09-15: 100 mg via INTRAVENOUS

## 2020-09-15 MED ORDER — LACTATED RINGERS IV SOLN: Freq: Once | INTRAVENOUS | Status: AC

## 2020-09-15 NOTE — Anesthesia Preprocedure Evaluation (Addendum)
Anesthesia Evaluation  Patient identified by MRN, date of birth, ID band Patient awake    Reviewed: Allergy & Precautions, NPO status , Patient's Chart, lab work & pertinent test results  Airway Mallampati: II  TM Distance: >3 FB Neck ROM: Full    Dental  (+) Dental Advisory Given, Missing   Pulmonary neg pulmonary ROS,    Pulmonary exam normal breath sounds clear to auscultation       Cardiovascular Exercise Tolerance: Good hypertension, Pt. on medications Normal cardiovascular exam Rhythm:Regular Rate:Normal     Neuro/Psych  Neuromuscular disease    GI/Hepatic negative GI ROS, Neg liver ROS,   Endo/Other  negative endocrine ROS  Renal/GU negative Renal ROS  negative genitourinary   Musculoskeletal negative musculoskeletal ROS (+)   Abdominal   Peds negative pediatric ROS (+)  Hematology negative hematology ROS (+)   Anesthesia Other Findings   Reproductive/Obstetrics negative OB ROS                            Anesthesia Physical Anesthesia Plan  ASA: II  Anesthesia Plan: General   Post-op Pain Management:    Induction: Intravenous  PONV Risk Score and Plan: TIVA  Airway Management Planned: Nasal Cannula and Natural Airway  Additional Equipment:   Intra-op Plan:   Post-operative Plan:   Informed Consent: I have reviewed the patients History and Physical, chart, labs and discussed the procedure including the risks, benefits and alternatives for the proposed anesthesia with the patient or authorized representative who has indicated his/her understanding and acceptance.     Dental advisory given  Plan Discussed with: CRNA and Surgeon  Anesthesia Plan Comments:         Anesthesia Quick Evaluation

## 2020-09-15 NOTE — Transfer of Care (Signed)
Immediate Anesthesia Transfer of Care Note  Patient: Tonya Pacheco  Procedure(s) Performed: COLONOSCOPY WITH PROPOFOL (N/A )  Patient Location: PACU  Anesthesia Type:General  Level of Consciousness: awake, alert  and oriented  Airway & Oxygen Therapy: Patient Spontanous Breathing  Post-op Assessment: Report given to RN, Post -op Vital signs reviewed and stable and Patient moving all extremities X 4  Post vital signs: Reviewed and stable  Last Vitals:  Vitals Value Taken Time  BP 128/85 09/15/20 0945  Temp    Pulse 74 09/15/20 0946  Resp 17 09/15/20 0946  SpO2 99 % 09/15/20 0946  Vitals shown include unvalidated device data.  Last Pain:  Vitals:   09/15/20 0917  TempSrc: Oral  PainSc: 0-No pain      Patients Stated Pain Goal: 10 (09/15/20 0818)  Complications: No complications documented.

## 2020-09-15 NOTE — Discharge Instructions (Signed)
You are being discharged to home.  °Resume your previous diet.  °Your physician has indicated that a repeat colonoscopy is not recommended due to your current age (66 years or older) for screening purposes.  ° °PATIENT INSTRUCTIONS °POST-ANESTHESIA ° °IMMEDIATELY FOLLOWING SURGERY:  Do not drive or operate machinery for the first twenty four hours after surgery.  Do not make any important decisions for twenty four hours after surgery or while taking narcotic pain medications or sedatives.  If you develop intractable nausea and vomiting or a severe headache please notify your doctor immediately. ° °FOLLOW-UP:  Please make an appointment with your surgeon as instructed. You do not need to follow up with anesthesia unless specifically instructed to do so. ° °WOUND CARE INSTRUCTIONS (if applicable):  Keep a dry clean dressing on the anesthesia/puncture wound site if there is drainage.  Once the wound has quit draining you may leave it open to air.  Generally you should leave the bandage intact for twenty four hours unless there is drainage.  If the epidural site drains for more than 36-48 hours please call the anesthesia department. ° °QUESTIONS?:  Please feel free to call your physician or the hospital operator if you have any questions, and they will be happy to assist you.  ° ° °Colonoscopy, Adult, Care After °This sheet gives you information about how to care for yourself after your procedure. Your doctor may also give you more specific instructions. If you have problems or questions, call your doctor. °What can I expect after the procedure? °After the procedure, it is common to have: °· A small amount of blood in your poop (stool) for 24 hours. °· Some gas. °· Mild cramping or bloating in your belly (abdomen). °Follow these instructions at home: °Eating and drinking ° °· Drink enough fluid to keep your pee (urine) pale yellow. °· Follow instructions from your doctor about what you cannot eat or drink. °· Return to  your normal diet as told by your doctor. Avoid heavy or fried foods that are hard to digest. °Activity °· Rest as told by your doctor. °· Do not sit for a long time without moving. Get up to take short walks every 1-2 hours. This is important. Ask for help if you feel weak or unsteady. °· Return to your normal activities as told by your doctor. Ask your doctor what activities are safe for you. °To help cramping and bloating: ° °· Try walking around. °· Put heat on your belly as told by your doctor. Use the heat source that your doctor recommends, such as a moist heat pack or a heating pad. °? Put a towel between your skin and the heat source. °? Leave the heat on for 20-30 minutes. °? Remove the heat if your skin turns bright red. This is very important if you are unable to feel pain, heat, or cold. You may have a greater risk of getting burned. °General instructions °· For the first 24 hours after the procedure: °? Do not drive or use machinery. °? Do not sign important documents. °? Do not drink alcohol. °? Do your daily activities more slowly than normal. °? Eat foods that are soft and easy to digest. °· Take over-the-counter or prescription medicines only as told by your doctor. °· Keep all follow-up visits as told by your doctor. This is important. °Contact a doctor if: °· You have blood in your poop 2-3 days after the procedure. °Get help right away if: °· You have more   than a small amount of blood in your poop. °· You see large clumps of tissue (blood clots) in your poop. °· Your belly is swollen. °· You feel like you may vomit (nauseous). °· You vomit. °· You have a fever. °· You have belly pain that gets worse, and medicine does not help your pain. °Summary °· After the procedure, it is common to have a small amount of blood in your poop. You may also have mild cramping and bloating in your belly. °· For the first 24 hours after the procedure, do not drive or use machinery, do not sign important documents,  and do not drink alcohol. °· Get help right away if you have a lot of blood in your poop, feel like you may vomit, have a fever, or have more belly pain. °This information is not intended to replace advice given to you by your health care provider. Make sure you discuss any questions you have with your health care provider. °Document Revised: 05/10/2019 Document Reviewed: 05/10/2019 °Elsevier Patient Education © 2020 [employer] ° ° °Hemorrhoids °Hemorrhoids are swollen veins that may develop: °· In the butt (rectum). These are called internal hemorrhoids. °· Around the opening of the butt (anus). These are called external hemorrhoids. °Hemorrhoids can cause pain, itching, or bleeding. Most of the time, they do not cause serious problems. They usually get better with diet changes, lifestyle changes, and other home treatments. °What are the causes? °This condition may be caused by: °· Having trouble pooping (constipation). °· Pushing hard (straining) to poop. °· Watery poop (diarrhea). °· Pregnancy. °· Being very overweight (obese). °· Sitting for long periods of time. °· Heavy lifting or other activity that causes you to strain. °· Anal sex. °· Riding a bike for a long period of time. °What are the signs or symptoms? °Symptoms of this condition include: °· Pain. °· Itching or soreness in the butt. °· Bleeding from the butt. °· Leaking poop. °· Swelling in the area. °· One or more lumps around the opening of your butt. °How is this diagnosed? °A doctor can often diagnose this condition by looking at the affected area. The doctor may also: °· Do an exam that involves feeling the area with a gloved hand (digital rectal exam). °· Examine the area inside your butt using a small tube (anoscope). °· Order blood tests. This may be done if you have lost a lot of blood. °· Have you get a test that involves looking inside the colon using a flexible tube with a camera on the end (sigmoidoscopy or colonoscopy). °How is this  treated? °This condition can usually be treated at home. Your doctor may tell you to change what you eat, make lifestyle changes, or try home treatments. If these do not help, procedures can be done to remove the hemorrhoids or make them smaller. These may involve: °· Placing rubber bands at the base of the hemorrhoids to cut off their blood supply. °· Injecting medicine into the hemorrhoids to shrink them. °· Shining a type of light energy onto the hemorrhoids to cause them to fall off. °· Doing surgery to remove the hemorrhoids or cut off their blood supply. °Follow these instructions at home: °Eating and drinking ° °· Eat foods that have a lot of fiber in them. These include whole grains, beans, nuts, fruits, and vegetables. °· Ask your doctor about taking products that have added fiber (fibersupplements). °· Reduce the amount of fat in your diet. You can do this   by: °? Eating low-fat dairy products. °? Eating less red meat. °? Avoiding processed foods. °· Drink enough fluid to keep your pee (urine) pale yellow. °Managing pain and swelling ° °· Take a warm-water bath (sitz bath) for 20 minutes to ease pain. Do this 3-4 times a day. You may do this in a bathtub or using a portable sitz bath that fits over the toilet. °· If told, put ice on the painful area. It may be helpful to use ice between your warm baths. °? Put ice in a plastic bag. °? Place a towel between your skin and the bag. °? Leave the ice on for 20 minutes, 2-3 times a day. °General instructions °· Take over-the-counter and prescription medicines only as told by your doctor. °? Medicated creams and medicines may be used as told. °· Exercise often. Ask your doctor how much and what kind of exercise is best for you. °· Go to the bathroom when you have the urge to poop. Do not wait. °· Avoid pushing too hard when you poop. °· Keep your butt dry and clean. Use wet toilet paper or moist towelettes after pooping. °· Do not sit on the toilet for a long  time. °· Keep all follow-up visits as told by your doctor. This is important. °Contact a doctor if you: °· Have pain and swelling that do not get better with treatment or medicine. °· Have trouble pooping. °· Cannot poop. °· Have pain or swelling outside the area of the hemorrhoids. °Get help right away if you have: °· Bleeding that will not stop. °Summary °· Hemorrhoids are swollen veins in the butt or around the opening of the butt. °· They can cause pain, itching, or bleeding. °· Eat foods that have a lot of fiber in them. These include whole grains, beans, nuts, fruits, and vegetables. °· Take a warm-water bath (sitz bath) for 20 minutes to ease pain. Do this 3-4 times a day. °This information is not intended to replace advice given to you by your health care provider. Make sure you discuss any questions you have with your health care provider. °Document Revised: 10/22/2018 Document Reviewed: 03/05/2018 °Elsevier Patient Education © 2020 [employer] ° °   ° ° ° °

## 2020-09-15 NOTE — H&P (Signed)
Tonya Pacheco is an 74 y.o. female.   Chief Complaint: screening colonoscopy HPI: 74 y/o female with PMH HTN, who comes to the hospital to undergo screening colonoscopy.  The patient had last colonoscopy 10 years ago.  She denies having any complaints such as melena, hematochezia, abdominal pain or distention, change in her bowel movement consistency or frequency, no changes in her weight recently.  No family history of colorectal cancer.   Past Medical History:  Diagnosis Date  . Hypertension     History reviewed. No pertinent surgical history.  Family History  Problem Relation Age of Onset  . Arthritis Sister 71       Rheumatoid Arthritis  . Cancer Maternal Grandmother   . Heart disease Sister   . Lupus Sister   . Kidney disease Brother 67       Kidney transplant   Social History:  reports that she has never smoked. She has never used smokeless tobacco. She reports that she does not drink alcohol and does not use drugs.  Allergies:  Allergies  Allergen Reactions  . Penicillins Swelling    Swelling mouth/ tongue   . Zetia [Ezetimibe] Swelling    Eyes puffy, tongue swelling  . Statins     No medications prior to admission.    No results found for this or any previous visit (from the past 48 hour(s)). No results found.  Review of Systems  Constitutional: Negative.   HENT: Negative.   Eyes: Negative.   Respiratory: Negative.   Cardiovascular: Negative.   Gastrointestinal: Negative.   Endocrine: Negative.   Genitourinary: Negative.   Musculoskeletal: Negative.   Skin: Negative.   Allergic/Immunologic: Negative.   Neurological: Negative.   Hematological: Negative.   Psychiatric/Behavioral: Negative.     Blood pressure 135/86, pulse 68, temperature 98 F (36.7 C), resp. rate (!) 21, height 5\' 7"  (1.702 m), weight 79.4 kg, SpO2 98 %. Physical Exam  GENERAL: The patient is AO x3, in no acute distress. HEENT: Head is normocephalic and atraumatic. EOMI are intact.  Mouth is well hydrated and without lesions. NECK: Supple. No masses LUNGS: Clear to auscultation. No presence of rhonchi/wheezing/rales. Adequate chest expansion HEART: RRR, normal s1 and s2. ABDOMEN: Soft, nontender, no guarding, no peritoneal signs, and nondistended. BS +. No masses. EXTREMITIES: Without any cyanosis, clubbing, rash, lesions or edema. NEUROLOGIC: AOx3, no focal motor deficit. SKIN: no jaundice, no rashes  Assessment/Plan 74 y/o female with PMH HTN, who comes to the hospital to undergo screening colonoscopy.  The patient is at average risk for colorectal cancer.  We will proceed with colonoscopy today.   63, MD 09/15/2020, 1:37 PM

## 2020-09-15 NOTE — Anesthesia Postprocedure Evaluation (Signed)
Anesthesia Post Note  Patient: Tonya Pacheco  Procedure(s) Performed: COLONOSCOPY WITH PROPOFOL (N/A )  Patient location during evaluation: PACU Anesthesia Type: General Level of consciousness: awake and alert and oriented Pain management: pain level controlled Vital Signs Assessment: post-procedure vital signs reviewed and stable Respiratory status: spontaneous breathing, nonlabored ventilation and respiratory function stable Cardiovascular status: blood pressure returned to baseline and stable Postop Assessment: no apparent nausea or vomiting Anesthetic complications: no   No complications documented.   Last Vitals:  Vitals:   09/15/20 0945 09/15/20 0958  BP: 128/85 135/86  Pulse: 77 68  Resp: (!) 21   Temp: 36.7 C   SpO2: 100% 98%    Last Pain:  Vitals:   09/15/20 0958  TempSrc: Oral  PainSc: 0-No pain                 Julian Reil

## 2020-09-15 NOTE — Op Note (Signed)
[facility] Patient Name: Tonya Pacheco Procedure Date: 09/15/2020 9:12 AM MRN: [id number] Date of Birth: 19-Feb-1946 Attending MD: Katrinka Blazing ,  CSN: [id number] Age: 74 Admit Type: Outpatient Procedure:                Colonoscopy Indications:              Screening for colorectal malignant neoplasm Providers:                Katrinka Blazing, Jannett Celestine, RN, Kristine L.                            Jessee Avers, Technician, Edythe Clarity, Pensions consultant Referring MD:              Medicines:                Monitored Anesthesia Care Complications:            No immediate complications. Estimated Blood Loss:     Estimated blood loss: none. Procedure:                Pre-Anesthesia Assessment:                           - Prior to the procedure, a History and Physical                            was performed, and patient medications, allergies                            and sensitivities were reviewed. The patient's                            tolerance of previous anesthesia was reviewed.                           - The risks and benefits of the procedure and the                            sedation options and risks were discussed with the                            patient. All questions were answered and informed                            consent was obtained.                           - ASA Grade Assessment: II - A patient with mild                            systemic disease.                           After obtaining informed consent, the colonoscope                            was passed under direct vision. Throughout  the                            procedure, the patient's blood pressure, pulse, and                            oxygen saturations were monitored continuously. The                            [id number]-[id number] ([id number]) scope was introduced through                            the anus and advanced to the the cecum, identified                            by appendiceal orifice and  ileocecal valve. The                            colonoscopy was performed without difficulty. The                            patient tolerated the procedure well. The quality                            of the bowel preparation was excellent. Scope                            withdrawal time was 10 minutes. Scope In: 9:22:46 AM Scope Out: 9:40:53 AM Scope Withdrawal Time: 0 hours 14 minutes 14 seconds  Total Procedure Duration: 0 hours 18 minutes 7 seconds  Findings:      The perianal and digital rectal examinations were normal.      The entire examined colon appeared normal on direct and retroflexion       views. Impression:               - The entire examined colon is normal on direct and                            retroflexion views.                           - No specimens collected. Moderate Sedation:      Per Anesthesia Care Recommendation:           - Discharge patient to home (ambulatory).                           - Resume previous diet.                           - Repeat colonoscopy is not recommended due to                            current age (61 years or older) for screening  purposes. Procedure Code(s):        --- Professional ---                           C6237, GC, Colorectal cancer screening; colonoscopy                            on individual not meeting criteria for high risk Diagnosis Code(s):        --- Professional ---                           Z12.11, Encounter for screening for malignant                            neoplasm of colon CPT copyright 2019 American Medical Association. All rights reserved. The codes documented in this report are preliminary and upon coder review may  be revised to meet current compliance requirements. Katrinka Blazing, MD Katrinka Blazing,  09/15/2020 9:45:17 AM This report has been signed electronically. Number of Addenda: 0

## 2020-09-15 NOTE — Anesthesia Procedure Notes (Signed)
Date/Time: 09/15/2020 9:27 AM Performed by: Julian Reil, CRNA Pre-anesthesia Checklist: Patient identified, Emergency Drugs available, Patient being monitored and Suction available Patient Re-evaluated:Patient Re-evaluated prior to induction Oxygen Delivery Method: Nasal cannula Induction Type: IV induction Placement Confirmation: positive ETCO2

## 2020-09-20 ENCOUNTER — Encounter (HOSPITAL_COMMUNITY): Payer: Self-pay | Admitting: Gastroenterology

## 2020-10-06 ENCOUNTER — Other Ambulatory Visit: Payer: Self-pay | Admitting: Family Medicine

## 2021-02-26 ENCOUNTER — Ambulatory Visit (INDEPENDENT_AMBULATORY_CARE_PROVIDER_SITE_OTHER): Payer: Medicare Other | Admitting: Internal Medicine

## 2021-02-26 ENCOUNTER — Encounter: Payer: Self-pay | Admitting: Internal Medicine

## 2021-02-26 ENCOUNTER — Other Ambulatory Visit: Payer: Self-pay

## 2021-02-26 VITALS — BP 131/78 | HR 76 | Temp 98.1°F | Resp 18 | Ht 67.5 in | Wt 181.1 lb

## 2021-02-26 DIAGNOSIS — Z7689 Persons encountering health services in other specified circumstances: Secondary | ICD-10-CM | POA: Diagnosis not present

## 2021-02-26 DIAGNOSIS — E782 Mixed hyperlipidemia: Secondary | ICD-10-CM

## 2021-02-26 DIAGNOSIS — R011 Cardiac murmur, unspecified: Secondary | ICD-10-CM

## 2021-02-26 DIAGNOSIS — M8589 Other specified disorders of bone density and structure, multiple sites: Secondary | ICD-10-CM

## 2021-02-26 DIAGNOSIS — I10 Essential (primary) hypertension: Secondary | ICD-10-CM | POA: Diagnosis not present

## 2021-02-26 NOTE — Progress Notes (Signed)
New Patient Office Visit  Subjective:  Patient ID: Tonya Pacheco, female    DOB: 06-14-1946  Age: 75 y.o. MRN: [id number]  CC:  Chief Complaint  Patient presents with  . New Patient (Initial Visit)    New patient just establishing care     HPI Tonya Pacheco is a 75 year old female with PMH of HTN, HLD and osteopenia who presents for establishing care.  She has been doing well overall. BP is well-controlled. Takes medications regularly. Patient denies headache, dizziness, chest pain, dyspnea or palpitations.  She takes Calcium and Vitamin D supplement for osteopenia.  She is up-to-date with COVID and pneumonia vaccine.  She lives by herself and takes care of her sister who lives nearby.  Past Medical History:  Diagnosis Date  . Hypertension     Past Surgical History:  Procedure Laterality Date  . COLONOSCOPY WITH PROPOFOL N/A 09/15/2020   Procedure: COLONOSCOPY WITH PROPOFOL;  Surgeon: Harvel Quale, MD;  Location: [facility];  Service: Gastroenterology;  Laterality: N/A;  17    Family History  Problem Relation Age of Onset  . Arthritis Sister 10       Rheumatoid Arthritis  . Cancer Maternal Grandmother   . Heart disease Sister   . Lupus Sister   . Kidney disease Brother 43       Kidney transplant    Social History   Socioeconomic History  . Marital status: Widowed    Spouse name: Not on file  . Number of children: Not on file  . Years of education: Not on file  . Highest education level: Not on file  Occupational History  . Not on file  Tobacco Use  . Smoking status: Never Smoker  . Smokeless tobacco: Never Used  Vaping Use  . Vaping Use: Never used  Substance and Sexual Activity  . Alcohol use: No  . Drug use: No  . Sexual activity: Not Currently  Other Topics Concern  . Not on file  Social History Narrative  . Not on file   Social Determinants of Health   Financial Resource Strain: Not on file  Food Insecurity: Not on file   Transportation Needs: Not on file  Physical Activity: Not on file  Stress: Not on file  Social Connections: Not on file  Intimate Partner Violence: Not on file    ROS Review of Systems  Constitutional: Negative for chills and fever.  HENT: Negative for congestion, sinus pressure, sinus pain and sore throat.   Eyes: Negative for pain and discharge.  Respiratory: Negative for cough and shortness of breath.   Cardiovascular: Negative for chest pain and palpitations.  Gastrointestinal: Negative for abdominal pain, constipation, diarrhea, nausea and vomiting.  Endocrine: Negative for polydipsia and polyuria.  Genitourinary: Negative for dysuria and hematuria.  Musculoskeletal: Negative for neck pain and neck stiffness.  Skin: Negative for rash.  Neurological: Negative for dizziness and weakness.  Psychiatric/Behavioral: Negative for agitation and behavioral problems.    Objective:   Today's Vitals: BP 131/78 (BP Location: Right Arm, Patient Position: Sitting, Cuff Size: Normal)   Pulse 76   Temp 98.1 F (36.7 C) (Oral)   Resp 18   Ht 5' 7.5" (1.715 m)   Wt 181 lb 1.9 oz (82.2 kg)   SpO2 97%   BMI 27.95 kg/m   Physical Exam Vitals reviewed.  Constitutional:      General: She is not in acute distress.    Appearance: She is not diaphoretic.  HENT:  Head: Normocephalic and atraumatic.     Nose: Nose normal.     Mouth/Throat:     Mouth: Mucous membranes are moist.  Eyes:     General: No scleral icterus.    Extraocular Movements: Extraocular movements intact.     Pupils: Pupils are equal, round, and reactive to light.  Cardiovascular:     Rate and Rhythm: Normal rate and regular rhythm.     Pulses: Normal pulses.     Heart sounds: Murmur (Systolic, most profound at left upper sternal border) heard.    Pulmonary:     Breath sounds: Normal breath sounds. No wheezing or rales.  Abdominal:     Palpations: Abdomen is soft.     Tenderness: There is no abdominal  tenderness.  Musculoskeletal:     Cervical back: Neck supple. No tenderness.     Right lower leg: No edema.     Left lower leg: No edema.  Skin:    General: Skin is warm.     Findings: No rash.  Neurological:     General: No focal deficit present.     Mental Status: She is alert and oriented to person, place, and time.  Psychiatric:        Mood and Affect: Mood normal.        Behavior: Behavior normal.     Assessment & Plan:   Problem List Items Addressed This Visit      Encounter to establish care    -  Primary Care established Previous chart reviewed History and medications reviewed with the patient  Relevant Orders  CBC with Differential/Platelet  CMP14+EGFR  TSH  Vitamin D (25 hydroxy)  Hemoglobin A1c    Cardiovascular and Mediastinum   Essential hypertension, benign    BP Readings from Last 1 Encounters:  02/26/21 131/78   Well-controlled with Amlodipine 5 mg QD Counseled for compliance with the medications Advised DASH diet and moderate exercise/walking, at least 150 mins/week       Relevant Orders   Hemoglobin A1c     Musculoskeletal and Integument   Osteopenia    On Calcium and Vitamin D supplements      Relevant Orders   Vitamin D (25 hydroxy)     Other   Hyperlipidemia    Last lipid profile reviewed Did not tolerate statin and Zetia in the past Will check lipid profile in the next visit. Continue to follow low carb and low cholesterol diet      Relevant Orders   Lipid Profile   Hemoglobin W3S   Systolic murmur    Discussed that most of the systolic murmurs are benign in nature Will order Echo in the next visit if change in quality or gradient, patient agrees.       Outpatient Encounter Medications as of 02/26/2021  Medication Sig  . amLODipine (NORVASC) 5 MG tablet Take 5 mg by mouth daily.  Marland Kitchen aspirin 81 MG tablet Take 81 mg by mouth daily.  . calcium carbonate (OS-CAL) 600 MG TABS tablet Take 600 mg by mouth every evening.   .  Multiple Vitamins-Minerals (CENTRUM ADULTS PO) Take 1 tablet by mouth every evening.   . [DISCONTINUED] amLODipine (NORVASC) 10 MG tablet TAKE 1 TABLET(10 MG) BY MOUTH DAILY (Patient not taking: Reported on 02/26/2021)   No facility-administered encounter medications on file as of 02/26/2021.    Follow-up: Return in about 3 months (around 05/29/2021) for Annual physical.   Lindell Spar, MD

## 2021-02-26 NOTE — Assessment & Plan Note (Signed)
BP Readings from Last 1 Encounters:  02/26/21 131/78   Well-controlled with Amlodipine 5 mg QD Counseled for compliance with the medications Advised DASH diet and moderate exercise/walking, at least 150 mins/week

## 2021-02-26 NOTE — Assessment & Plan Note (Addendum)
Discussed that most of the systolic murmurs are benign in nature Will order Echo in the next visit if change in quality or gradient, patient agrees.

## 2021-02-26 NOTE — Patient Instructions (Addendum)
Please continue taking medications as prescribed.  Please continue to follow low salt diet and perform moderate exercise/walking at least 150 mins/week.  Please get fasting blood tests done before the next visit. 

## 2021-02-26 NOTE — Assessment & Plan Note (Signed)
Last lipid profile reviewed Did not tolerate statin and Zetia in the past Will check lipid profile in the next visit. Continue to follow low carb and low cholesterol diet

## 2021-02-26 NOTE — Assessment & Plan Note (Signed)
On Calcium and Vitamin D supplements 

## 2021-05-29 ENCOUNTER — Encounter: Payer: Medicare Other | Admitting: Internal Medicine

## 2021-06-26 ENCOUNTER — Ambulatory Visit (INDEPENDENT_AMBULATORY_CARE_PROVIDER_SITE_OTHER): Payer: Medicare Other

## 2021-06-26 DIAGNOSIS — Z0000 Encounter for general adult medical examination without abnormal findings: Secondary | ICD-10-CM | POA: Diagnosis not present

## 2021-06-26 NOTE — Patient Instructions (Signed)
Health Maintenance, Female Adopting a healthy lifestyle and getting preventive care are important in promoting health and wellness. Ask your health care provider about: The right schedule for you to have regular tests and exams. Things you can do on your own to prevent diseases and keep yourself healthy. What should I know about diet, weight, and exercise? Eat a healthy diet  Eat a diet that includes plenty of vegetables, fruits, low-fat dairy products, and lean protein. Do not eat a lot of foods that are high in solid fats, added sugars, or sodium.  Maintain a healthy weight Body mass index (BMI) is used to identify weight problems. It estimates body fat based on height and weight. Your health care provider can help determineyour BMI and help you achieve or maintain a healthy weight. Get regular exercise Get regular exercise. This is one of the most important things you can do for your health. Most adults should: Exercise for at least 150 minutes each week. The exercise should increase your heart rate and make you sweat (moderate-intensity exercise). Do strengthening exercises at least twice a week. This is in addition to the moderate-intensity exercise. Spend less time sitting. Even light physical activity can be beneficial. Watch cholesterol and blood lipids Have your blood tested for lipids and cholesterol at 73 years of age, then havethis test every 5 years. Have your cholesterol levels checked more often if: Your lipid or cholesterol levels are high. You are older than 73 years of age. You are at high risk for heart disease. What should I know about cancer screening? Depending on your health history and family history, you may need to have cancer screening at various ages. This may include screening for: Breast cancer. Cervical cancer. Colorectal cancer. Skin cancer. Lung cancer. What should I know about heart disease, diabetes, and high blood pressure? Blood pressure and heart  disease High blood pressure causes heart disease and increases the risk of stroke. This is more likely to develop in people who have high blood pressure readings, are of African descent, or are overweight. Have your blood pressure checked: Every 3-5 years if you are 18-39 years of age. Every year if you are 40 years old or older. Diabetes Have regular diabetes screenings. This checks your fasting blood sugar level. Have the screening done: Once every three years after age 40 if you are at a normal weight and have a low risk for diabetes. More often and at a younger age if you are overweight or have a high risk for diabetes. What should I know about preventing infection? Hepatitis B If you have a higher risk for hepatitis B, you should be screened for this virus. Talk with your health care provider to find out if you are at risk forhepatitis B infection. Hepatitis C Testing is recommended for: Everyone born from 1945 through 1965. Anyone with known risk factors for hepatitis C. Sexually transmitted infections (STIs) Get screened for STIs, including gonorrhea and chlamydia, if: You are sexually active and are younger than 73 years of age. You are older than 73 years of age and your health care provider tells you that you are at risk for this type of infection. Your sexual activity has changed since you were last screened, and you are at increased risk for chlamydia or gonorrhea. Ask your health care provider if you are at risk. Ask your health care provider about whether you are at high risk for HIV. Your health care provider may recommend a prescription medicine to help   prevent HIV infection. If you choose to take medicine to prevent HIV, you should first get tested for HIV. You should then be tested every 3 months for as long as you are taking the medicine. Pregnancy If you are about to stop having your period (premenopausal) and you may become pregnant, seek counseling before you get  pregnant. Take 400 to 800 micrograms (mcg) of folic acid every day if you become pregnant. Ask for birth control (contraception) if you want to prevent pregnancy. Osteoporosis and menopause Osteoporosis is a disease in which the bones lose minerals and strength with aging. This can result in bone fractures. If you are 65 years old or older, or if you are at risk for osteoporosis and fractures, ask your health care provider if you should: Be screened for bone loss. Take a calcium or vitamin D supplement to lower your risk of fractures. Be given hormone replacement therapy (HRT) to treat symptoms of menopause. Follow these instructions at home: Lifestyle Do not use any products that contain nicotine or tobacco, such as cigarettes, e-cigarettes, and chewing tobacco. If you need help quitting, ask your health care provider. Do not use street drugs. Do not share needles. Ask your health care provider for help if you need support or information about quitting drugs. Alcohol use Do not drink alcohol if: Your health care provider tells you not to drink. You are pregnant, may be pregnant, or are planning to become pregnant. If you drink alcohol: Limit how much you use to 0-1 drink a day. Limit intake if you are breastfeeding. Be aware of how much alcohol is in your drink. In the [place] one drink equals one 12 oz bottle of beer (355 mL), one 5 oz glass of wine (148 mL), or one 1 oz glass of hard liquor (44 mL). General instructions Schedule regular health, dental, and eye exams. Stay current with your vaccines. Tell your health care provider if: You often feel depressed. You have ever been abused or do not feel safe at home. Summary Adopting a healthy lifestyle and getting preventive care are important in promoting health and wellness. Follow your health care provider's instructions about healthy diet, exercising, and getting tested or screened for diseases. Follow your health care provider's  instructions on monitoring your cholesterol and blood pressure. This information is not intended to replace advice given to you by your health care provider. Make sure you discuss any questions you have with your healthcare provider. Document Revised: 10/07/2018 Document Reviewed: 10/07/2018 Elsevier Patient Education  2022 Elsevier Inc.  

## 2021-06-26 NOTE — Progress Notes (Signed)
Subjective:   Tonya Pacheco is a 75 y.o. female who presents for Medicare Annual (Subsequent) preventive examination.I connected with  Tonya Pacheco on 06/26/21 by a audio enabled telemedicine application and verified that I am speaking with the correct person using two identifiers.   I discussed the limitations of evaluation and management by telemedicine. The patient expressed understanding and agreed to proceed.   Location of patient: Home  Location of Provider: Office  Persons participating in virtual visit: Tonya Pacheco (patient) and Rowland Ericsson [place]   Review of Systems    Defer to PCP        Objective:    There were no vitals filed for this visit. There is no height or weight on file to calculate BMI.  Advanced Directives 06/26/2021 09/15/2020 03/29/2020 01/05/2019 07/09/2018 07/01/2018  Does Patient Have a Medical Advance Directive? Yes Yes No No No No  Type of Estate agent of [place];Living will Living will - - - -  Would patient like information on creating a medical advance directive? - - Yes (MAU/Ambulatory/Procedural Areas - Information given) Yes (MAU/Ambulatory/Procedural Areas - Information given) - -    Current Medications (verified) Outpatient Encounter Medications as of 06/26/2021  Medication Sig   amLODipine (NORVASC) 5 MG tablet Take 5 mg by mouth daily.   aspirin 81 MG tablet Take 81 mg by mouth daily.   calcium carbonate (OS-CAL) 600 MG TABS tablet Take 600 mg by mouth every evening.    Multiple Vitamins-Minerals (CENTRUM ADULTS PO) Take 1 tablet by mouth every evening.    No facility-administered encounter medications on file as of 06/26/2021.    Allergies (verified) Penicillins, Zetia [ezetimibe], and Statins   History: Past Medical History:  Diagnosis Date   Hypertension    Past Surgical History:  Procedure Laterality Date   COLONOSCOPY WITH PROPOFOL N/A 09/15/2020   Procedure: COLONOSCOPY WITH PROPOFOL;  Surgeon: Dolores Frame, MD;  Location[facility];  Service: Gastroenterology;  Laterality: N/A;  915   Family History  Problem Relation Age of Onset   Arthritis Sister 6       Rheumatoid Arthritis   Cancer Maternal Grandmother    Heart disease Sister    Lupus Sister    Kidney disease Brother 59       Kidney transplant   Social History   Socioeconomic History   Marital status: Widowed    Spouse name: Not on file   Number of children: Not on file   Years of education: Not on file   Highest education level: Not on file  Occupational History   Not on file  Tobacco Use   Smoking status: Never   Smokeless tobacco: Never  Vaping Use   Vaping Use: Never used  Substance and Sexual Activity   Alcohol use: No   Drug use: No   Sexual activity: Not Currently  Other Topics Concern   Not on file  Social History Narrative   Not on file   Social Determinants of Health   Financial Resource Strain: Low Risk    Difficulty of Paying Living Expenses: Not hard at all  Food Insecurity: No Food Insecurity   Worried About Running Out of Food in the Last Year: Never true   Ran Out of Food in the Last Year: Never true  Transportation Needs: No Transportation Needs   Lack of Transportation (Medical): No   Lack of Transportation (Non-Medical): No  Physical Activity: Sufficiently Active   Days of Exercise per Week:  6 days   Minutes of Exercise per Session: 30 min  Stress: No Stress Concern Present   Feeling of Stress : Not at all  Social Connections: Moderately Isolated   Frequency of Communication with Friends and Family: More than three times a week   Frequency of Social Gatherings with Friends and Family: Three times a week   Attends Religious Services: More than 4 times per year   Active Member of Clubs or Organizations: No   Attends Banker Meetings: Never   Marital Status: Widowed    Tobacco Counseling Counseling given: Not Answered   Clinical Intake:  Pre-visit  preparation completed: Yes  Pain : No/denies pain     Nutritional Risks: None Diabetes: No  How often do you need to have someone help you when you read instructions, pamphlets, or other written materials from your doctor or pharmacy?: 1 - Never What is the last grade level you completed in school?: College  Diabetic?NO  Interpreter Needed?: No  Information entered by :: Kenechukwu Eckstein J,CMA   Activities of Daily Living In your present state of health, do you have any difficulty performing the following activities: 06/26/2021  Hearing? N  Vision? N  Difficulty concentrating or making decisions? N  Walking or climbing stairs? N  Dressing or bathing? N  Doing errands, shopping? N  Preparing Food and eating ? N  Using the Toilet? N  In the past six months, have you accidently leaked urine? N  Do you have problems with loss of bowel control? N  Managing your Medications? N  Managing your Finances? N  Housekeeping or managing your Housekeeping? N  Some recent data might be hidden    Patient Care Team: Anabel Halon, MD as PCP - General (Internal Medicine)  Indicate any recent Medical Services you may have received from other than [facility] providers in the past year (date may be approximate).     Assessment:   This is a routine wellness examination for [place].  Hearing/Vision screen No results found.  Dietary issues and exercise activities discussed: Current Exercise Habits: Home exercise routine, Type of exercise: walking, Time (Minutes): 30, Frequency (Times/Week): 6, Weekly Exercise (Minutes/Week): 180, Intensity: Mild   Goals Addressed   None    Depression Screen PHQ 2/9 Scores 06/26/2021 02/26/2021 03/29/2020 01/05/2019 07/08/2018 12/29/2017 07/30/2016  PHQ - 2 Score 0 0 0 0 0 0 0  PHQ- 9 Score - - 0 - - 0 0    Fall Risk Fall Risk  06/26/2021 02/26/2021 03/29/2020 01/05/2019 07/08/2018  Falls in the past year? 0 0 0 0 No  Number falls in past yr: 0 0 - - -  Injury with Fall? 0 0 -  - -  Risk for fall due to : No Fall Risks No Fall Risks No Fall Risks - -  Follow up Falls evaluation completed Falls evaluation completed Falls evaluation completed Falls evaluation completed -    FALL RISK PREVENTION PERTAINING TO THE HOME:  Any stairs in or around the home? No  If so, are there any without handrails?  N/A Home free of loose throw rugs in walkways, pet beds, electrical cords, etc? Yes  Adequate lighting in your home to reduce risk of falls? Yes   ASSISTIVE DEVICES UTILIZED TO PREVENT FALLS:  Life alert? No  Use of a cane, walker or w/c? No  Grab bars in the bathroom? No  Shower chair or bench in shower? No  Elevated toilet seat or a handicapped toilet?  Yes   TIMED UP AND GO:  Was the test performed?  N/A .  Length of time to ambulate 10 feet: N/A sec.     Cognitive Function:     6CIT Screen 06/26/2021  What Year? 0 points  What month? 0 points  What time? 0 points  Count back from 20 0 points  Months in reverse 2 points  Repeat phrase 0 points  Total Score 2    Immunizations Immunization History  Administered Date(s) Administered   Moderna SARS-COV2 Booster Vaccination 10/02/2020   Moderna Sars-Covid-2 Vaccination 01/06/2020, 02/05/2020   Pneumococcal Conjugate-13 03/06/2015   Pneumococcal Polysaccharide-23 07/30/2016    TDAP status: Due, Education has been provided regarding the importance of this vaccine. Advised may receive this vaccine at local pharmacy or Health Dept. Aware to provide a copy of the vaccination record if obtained from local pharmacy or Health Dept. Verbalized acceptance and understanding.  Flu Vaccine status: Due, Education has been provided regarding the importance of this vaccine. Advised may receive this vaccine at local pharmacy or Health Dept. Aware to provide a copy of the vaccination record if obtained from local pharmacy or Health Dept. Verbalized acceptance and understanding.  Pneumococcal vaccine status: Up to  date  Covid-19 vaccine status: Information provided on how to obtain vaccines.   Qualifies for Shingles Vaccine? Yes   Zostavax completed  N/A   Shingrix Completed?: No.    Education has been provided regarding the importance of this vaccine. Patient has been advised to call insurance company to determine out of pocket expense if they have not yet received this vaccine. Advised may also receive vaccine at local pharmacy or Health Dept. Verbalized acceptance and understanding.  Screening Tests Health Maintenance  Topic Date Due   Zoster Vaccines- Shingrix (1 of 2) Never done   TETANUS/TDAP  10/28/2020   COVID-19 Vaccine (4 - Booster for Moderna series) 01/31/2021   INFLUENZA VACCINE  05/28/2021   MAMMOGRAM  08/02/2022   DEXA SCAN  Completed   Hepatitis C Screening  Completed   PNA vac Low Risk Adult  Completed   HPV VACCINES  Aged Out    Health Maintenance  Health Maintenance Due  Topic Date Due   Zoster Vaccines- Shingrix (1 of 2) Never done   TETANUS/TDAP  10/28/2020   COVID-19 Vaccine (4 - Booster for Moderna series) 01/31/2021   INFLUENZA VACCINE  05/28/2021    Colorectal cancer screening: No longer required.   Mammogram status: Completed 08/02/2020. Repeat every year  Bone Density status: Completed 08/22/2020. Results reflect: Bone density results: OSTEOPENIA. Repeat every 2 years.  Lung Cancer Screening: (Low Dose CT Chest recommended if Age 75-80 years, 30 pack-year currently smoking OR have quit w/in 15years.) does not qualify.   Lung Cancer Screening Referral: NO  Additional Screening:  Hepatitis C Screening: does qualify; Completed 12/29/2017  Vision Screening: Recommended annual ophthalmology exams for early detection of glaucoma and other disorders of the eye. Is the patient up to date with their annual eye exam?  Yes  Who is the provider or what is the name of the office in which the patient attends annual eye exams? [facility]  If pt is not established  with a provider, would they like to be referred to a provider to establish care? No .   Dental Screening: Recommended annual dental exams for proper oral hygiene  Community Resource Referral / Chronic Care Management: CRR required this visit?  No   CCM required this visit?  No      Plan:     I have personally reviewed and noted the following in the patient's chart:   Medical and social history Use of alcohol, tobacco or illicit drugs  Current medications and supplements including opioid prescriptions.  Functional ability and status Nutritional status Physical activity Advanced directives List of other physicians Hospitalizations, surgeries, and ER visits in previous 12 months Vitals Screenings to include cognitive, depression, and falls Referrals and appointments  In addition, I have reviewed and discussed with patient certain preventive protocols, quality metrics, and best practice recommendations. A written personalized care plan for preventive services as well as general preventive health recommendations were provided to patient.     Victorio Palm, CMA   06/26/2021   Nurse Notes: Non Face to Face 30 minute visit Encounter   [name] , Thank you for taking time to come for your Medicare Wellness Visit. I appreciate your ongoing commitment to your health goals. Please review the following plan we discussed and let me know if I can assist you in the future.   These are the goals we discussed:  Goals   None     This is a list of the screening recommended for you and due dates:  Health Maintenance  Topic Date Due   Zoster (Shingles) Vaccine (1 of 2) Never done   Tetanus Vaccine  10/28/2020   COVID-19 Vaccine (4 - Booster for Moderna series) 01/31/2021   Flu Shot  05/28/2021   Mammogram  08/02/2022   DEXA scan (bone density measurement)  Completed   Hepatitis C Screening: USPSTF Recommendation to screen - Ages 74-79 yo.  Completed   Pneumonia vaccines  Completed    HPV Vaccine  Aged Out    Patient has stated that she will her AVS at her follow up visit that's already scheduled with PCP.

## 2021-07-16 DIAGNOSIS — H5203 Hypermetropia, bilateral: Secondary | ICD-10-CM | POA: Diagnosis not present

## 2021-07-25 IMAGING — MG DIGITAL SCREENING BILAT W/ TOMO W/ CAD
8 series · 8 of 24 positions shown · non-contrast
Comparison: Previous exam(s).

CLINICAL DATA: Screening.

EXAM:
DIGITAL SCREENING BILATERAL MAMMOGRAM WITH TOMO AND CAD

[L CC synth-2D]
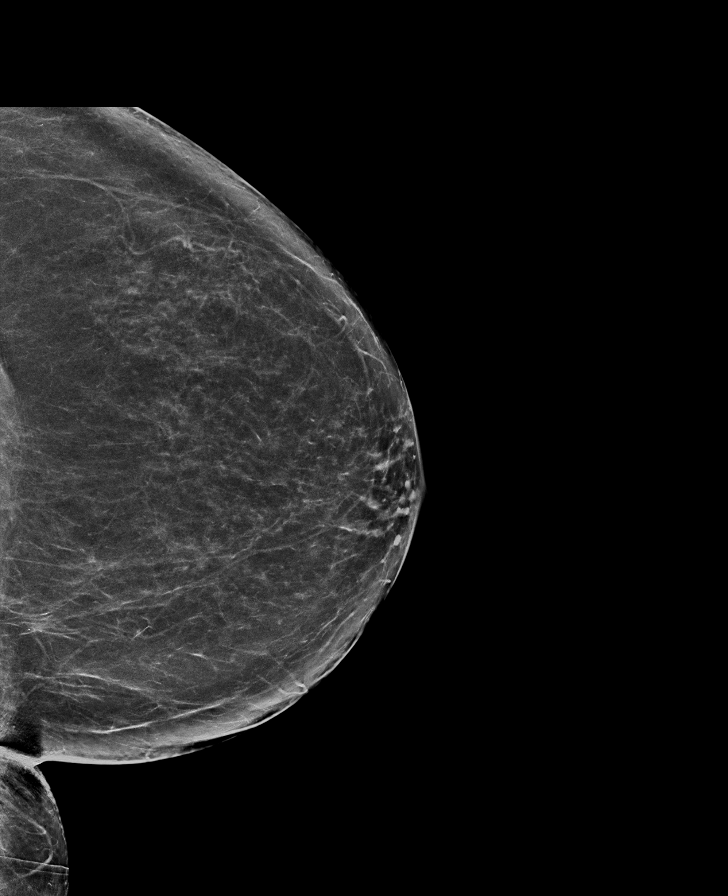

[R CC synth-2D]
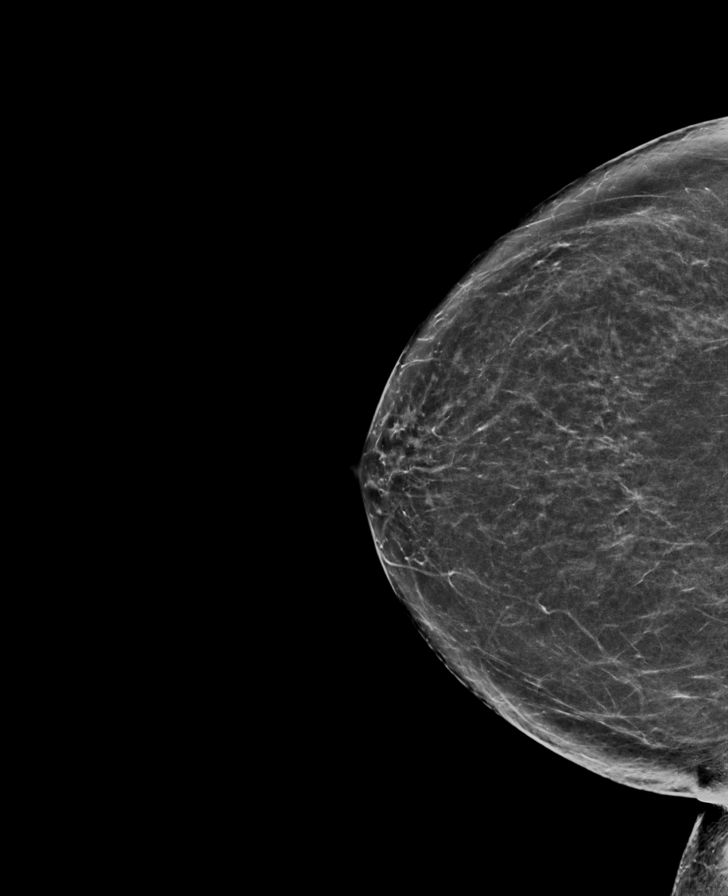

[R MLO synth-2D]
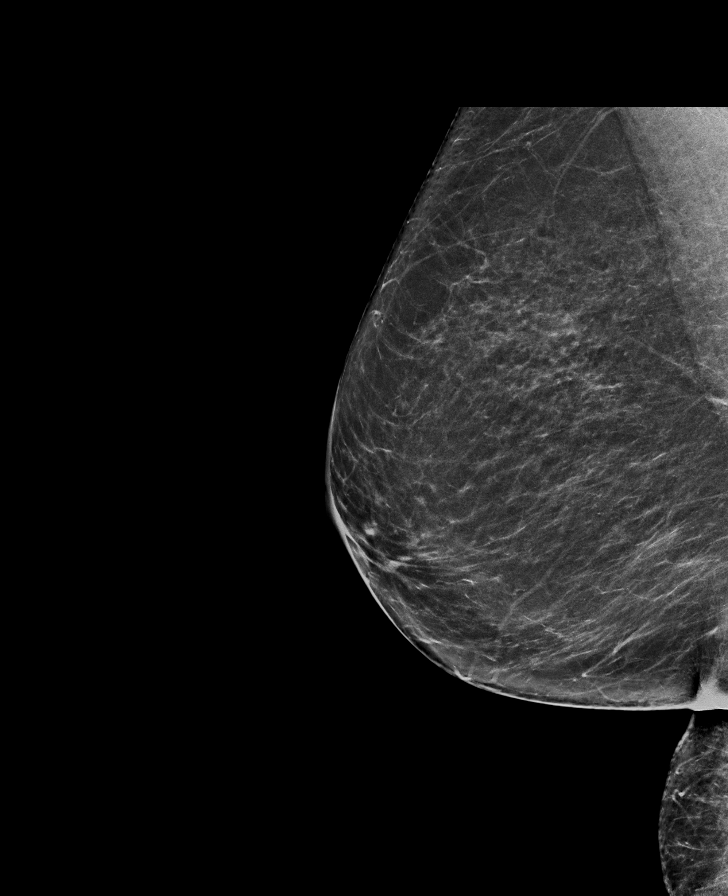

[L MLO synth-2D]
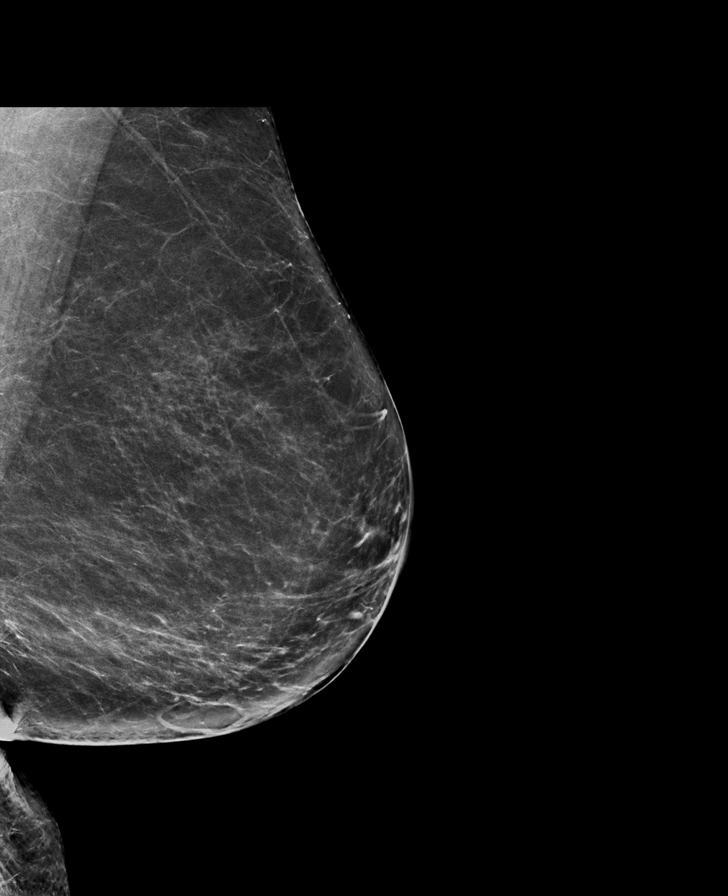

[R CC tomo · tomo slice 35/69.0]
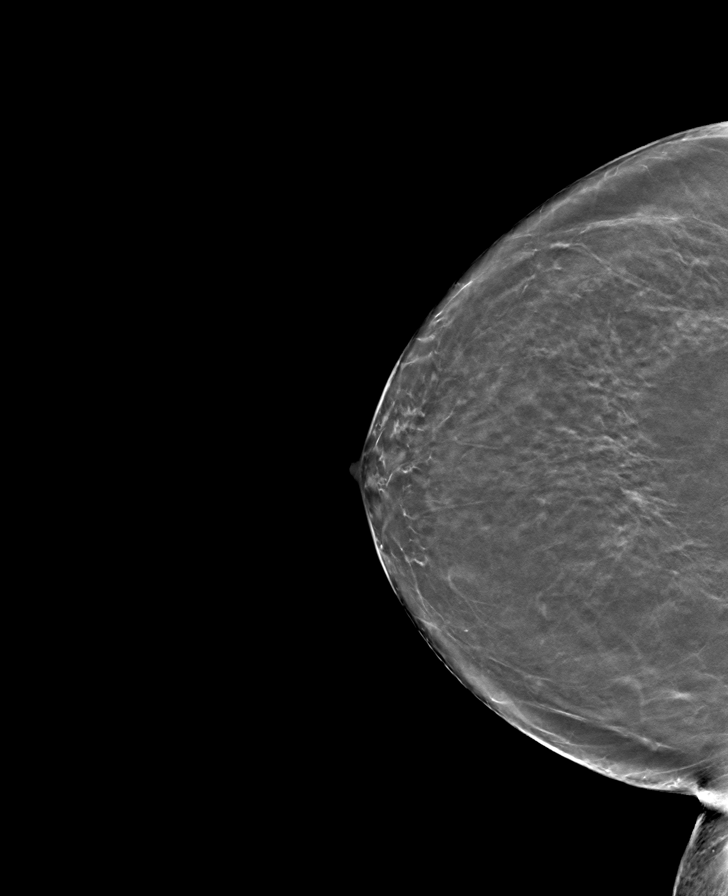

[L CC tomo · tomo slice 37/74.0]
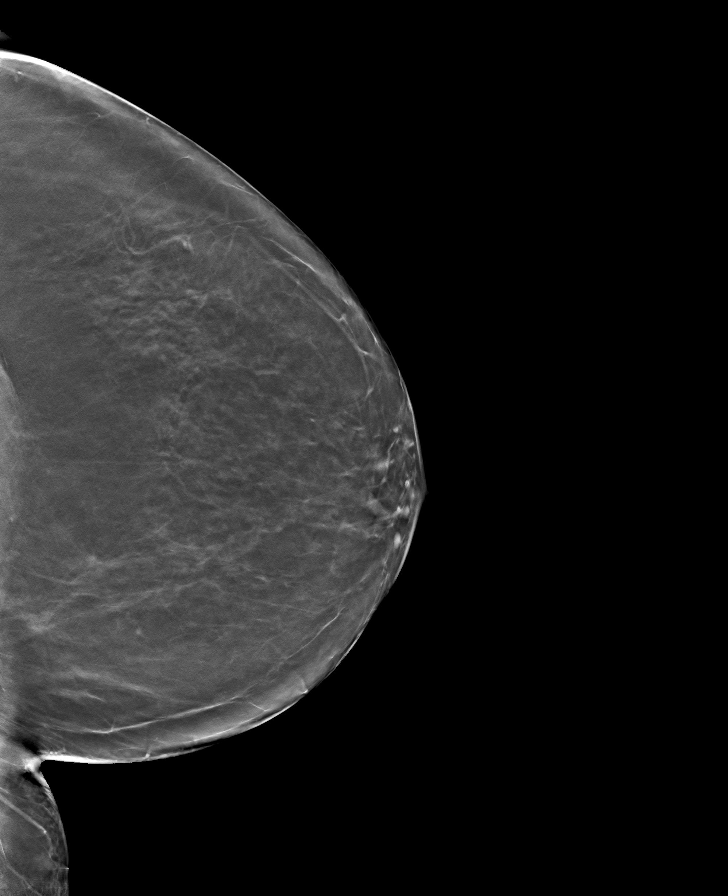

[R MLO tomo · tomo slice 35/70.0]
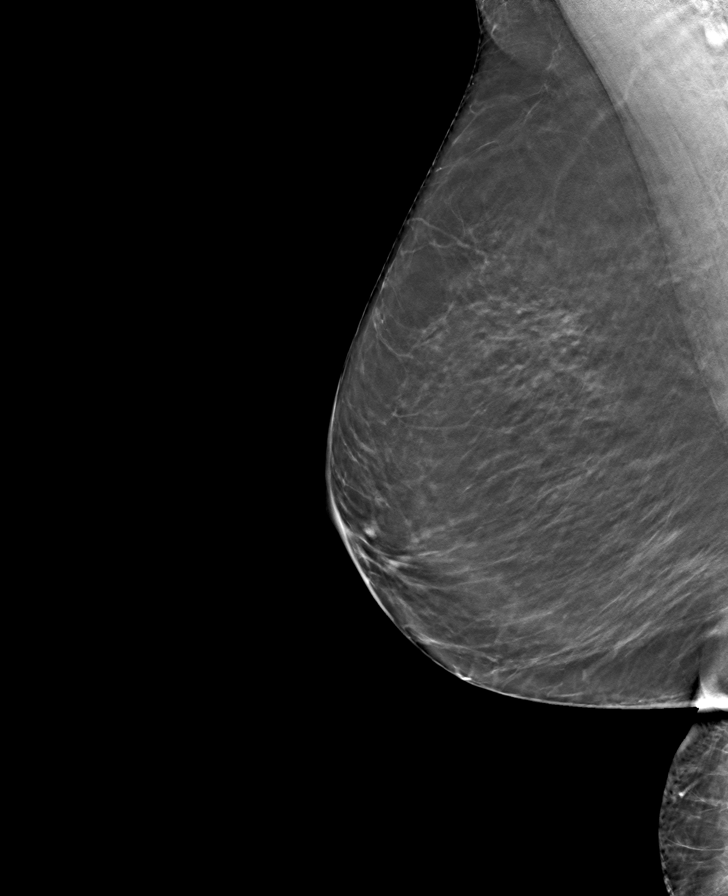

[L MLO tomo · tomo slice 39/77.0]
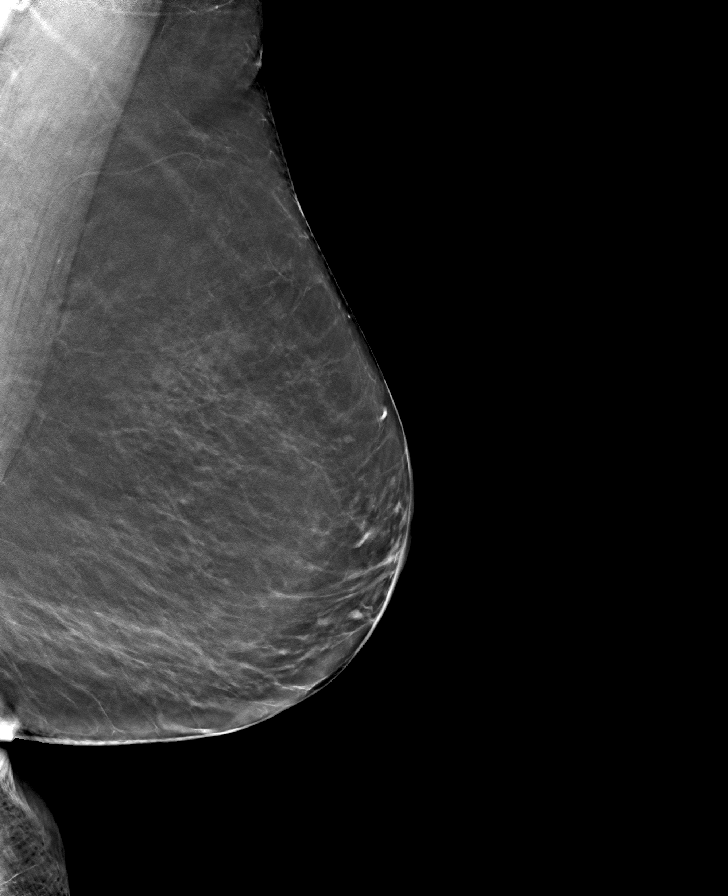

[8 of 24 positions shown; findings below may reference images not displayed]

ACR Breast Density Category b: There are scattered areas of
fibroglandular density.
FINDINGS: In the left breast, a possible asymmetry warrants further
evaluation. This possible asymmetry is seen within the inner LEFT
breast, at posterior depth, CC view only, slice 40.

In the right breast, no findings suspicious for malignancy. Images
were processed with CAD.
IMPRESSION: Further evaluation is suggested for possible asymmetry in the left
breast.

RECOMMENDATION:
Diagnostic mammogram and possibly ultrasound of the left breast.
(Code:[id number])

The patient will be contacted regarding the findings, and additional
imaging will be scheduled.

BI-RADS CATEGORY  0: Incomplete. Need additional imaging evaluation
and/or prior mammograms for comparison.

## 2021-09-03 ENCOUNTER — Encounter: Payer: Self-pay | Admitting: Internal Medicine

## 2021-09-03 ENCOUNTER — Other Ambulatory Visit: Payer: Self-pay

## 2021-09-03 ENCOUNTER — Ambulatory Visit (INDEPENDENT_AMBULATORY_CARE_PROVIDER_SITE_OTHER): Payer: Medicare Other | Admitting: Internal Medicine

## 2021-09-03 VITALS — BP 140/88 | HR 73 | Temp 97.9°F | Resp 16 | Ht 67.5 in | Wt 177.0 lb

## 2021-09-03 DIAGNOSIS — E559 Vitamin D deficiency, unspecified: Secondary | ICD-10-CM | POA: Diagnosis not present

## 2021-09-03 DIAGNOSIS — E782 Mixed hyperlipidemia: Secondary | ICD-10-CM

## 2021-09-03 DIAGNOSIS — T466X5A Adverse effect of antihyperlipidemic and antiarteriosclerotic drugs, initial encounter: Secondary | ICD-10-CM

## 2021-09-03 DIAGNOSIS — Z23 Encounter for immunization: Secondary | ICD-10-CM | POA: Diagnosis not present

## 2021-09-03 DIAGNOSIS — R011 Cardiac murmur, unspecified: Secondary | ICD-10-CM

## 2021-09-03 DIAGNOSIS — Z0001 Encounter for general adult medical examination with abnormal findings: Secondary | ICD-10-CM | POA: Diagnosis not present

## 2021-09-03 DIAGNOSIS — M609 Myositis, unspecified: Secondary | ICD-10-CM

## 2021-09-03 NOTE — Patient Instructions (Signed)
Please continue to follow low salt diet and ambulate as tolerated.  Please continue to take medications as prescribed.

## 2021-09-04 LAB — CBC WITH DIFFERENTIAL/PLATELET
Basophils Absolute: 0 10*3/uL (ref 0.0–0.2)
Basos: 0 %
EOS (ABSOLUTE): 0.1 10*3/uL (ref 0.0–0.4)
Eos: 2 %
Hematocrit: 40.4 % (ref 34.0–46.6)
Hemoglobin: 13.4 g/dL (ref 11.1–15.9)
Immature Grans (Abs): 0 10*3/uL (ref 0.0–0.1)
Immature Granulocytes: 0 %
Lymphocytes Absolute: 2.1 10*3/uL (ref 0.7–3.1)
Lymphs: 42 %
MCH: 30.6 pg (ref 26.6–33.0)
MCHC: 33.2 g/dL (ref 31.5–35.7)
MCV: 92 fL (ref 79–97)
Monocytes Absolute: 0.4 10*3/uL (ref 0.1–0.9)
Monocytes: 7 %
Neutrophils Absolute: 2.5 10*3/uL (ref 1.4–7.0)
Neutrophils: 49 %
Platelets: 230 10*3/uL (ref 150–450)
RBC: 4.38 x10E6/uL (ref 3.77–5.28)
RDW: 12.9 % (ref 11.7–15.4)
WBC: 5.1 10*3/uL (ref 3.4–10.8)

## 2021-09-04 LAB — CMP14+EGFR
ALT: 13 IU/L (ref 0–32)
AST: 19 IU/L (ref 0–40)
Albumin/Globulin Ratio: 1.7 (ref 1.2–2.2)
Albumin: 4.5 g/dL (ref 3.7–4.7)
Alkaline Phosphatase: 90 IU/L (ref 44–121)
BUN/Creatinine Ratio: 15 (ref 12–28)
BUN: 14 mg/dL (ref 8–27)
Bilirubin Total: 0.4 mg/dL (ref 0.0–1.2)
CO2: 24 mmol/L (ref 20–29)
Calcium: 9.6 mg/dL (ref 8.7–10.3)
Chloride: 106 mmol/L (ref 96–106)
Creatinine, Ser: 0.96 mg/dL (ref 0.57–1.00)
Globulin, Total: 2.7 g/dL (ref 1.5–4.5)
Glucose: 96 mg/dL (ref 70–99)
Potassium: 4.7 mmol/L (ref 3.5–5.2)
Sodium: 143 mmol/L (ref 134–144)
Total Protein: 7.2 g/dL (ref 6.0–8.5)
eGFR: 62 mL/min/{1.73_m2} (ref >59)

## 2021-09-04 LAB — TSH: TSH: 3.05 u[IU]/mL (ref 0.450–4.500)

## 2021-09-04 LAB — VITAMIN D 25 HYDROXY (VIT D DEFICIENCY, FRACTURES): Vit D, 25-Hydroxy: 27.7 ng/mL — ABNORMAL LOW (ref 30.0–100.0)

## 2021-09-04 LAB — LIPID PANEL
Chol/HDL Ratio: 3.9 ratio (ref 0.0–4.4)
Cholesterol, Total: 291 mg/dL — ABNORMAL HIGH (ref 100–199)
HDL: 74 mg/dL (ref >39)
LDL Chol Calc (NIH): 202 mg/dL — ABNORMAL HIGH (ref 0–99)
Triglycerides: 92 mg/dL (ref 0–149)
VLDL Cholesterol Cal: 15 mg/dL (ref 5–40)

## 2021-09-04 LAB — HEMOGLOBIN A1C
Est. average glucose Bld gHb Est-mCnc: 126 mg/dL
Hgb A1c MFr Bld: 6 % — ABNORMAL HIGH (ref 4.8–5.6)

## 2021-09-05 ENCOUNTER — Other Ambulatory Visit: Payer: Self-pay | Admitting: *Deleted

## 2021-09-05 DIAGNOSIS — E7800 Pure hypercholesterolemia, unspecified: Secondary | ICD-10-CM

## 2021-09-07 ENCOUNTER — Telehealth: Payer: Self-pay | Admitting: *Deleted

## 2021-09-07 NOTE — Assessment & Plan Note (Signed)
Last lipid profile reviewed Did not tolerate statin and Zetia in the past Continue to follow low carb and low cholesterol diet

## 2021-09-07 NOTE — Assessment & Plan Note (Signed)
Annual exam as documented. Counseling done  re healthy lifestyle involving commitment to 150 minutes exercise per week, heart healthy diet, and attaining healthy weight.The importance of adequate sleep also discussed. Changes in health habits are decided on by the patient with goals and time frames  set for achieving them. Immunization and cancer screening needs are specifically addressed at this visit. 

## 2021-09-07 NOTE — Progress Notes (Signed)
Established Patient Office Visit  Subjective:  Patient ID: Tonya Pacheco, female    DOB: [birth year]  Age: 75 y.o. MRN: [id number]  CC:  Chief Complaint  Patient presents with   Annual Exam    Annual exam     HPI Tonya Pacheco is a 75 y.o. female with past medical history of HTN, osteopenia and HLD who presents for annual physical.  She has been doing well overall.  BP is well-controlled. Takes medications regularly. Patient denies headache, dizziness, chest pain, dyspnea or palpitations.  She has a history of HLD, but had myositis with statin.  She received flu vaccine in the office today.  Past Medical History:  Diagnosis Date   Hypertension     Past Surgical History:  Procedure Laterality Date   COLONOSCOPY WITH PROPOFOL N/A 09/15/2020   Procedure: COLONOSCOPY WITH PROPOFOL;  Surgeon: Harvel Quale, MD;  [facility]: [facility];  Service: Gastroenterology;  Laterality: N/A;  915    Family History  Problem Relation Age of Onset   Arthritis Sister 45       Rheumatoid Arthritis   Cancer Maternal Grandmother    Heart disease Sister    Lupus Sister    Kidney disease Brother 57       Kidney transplant    Social History   Socioeconomic History   Marital status: Widowed    Spouse name: Not on file   Number of children: Not on file   Years of education: Not on file   Highest education level: Not on file  Occupational History   Not on file  Tobacco Use   Smoking status: Never   Smokeless tobacco: Never  Vaping Use   Vaping Use: Never used  Substance and Sexual Activity   Alcohol use: No   Drug use: No   Sexual activity: Not Currently  Other Topics Concern   Not on file  Social History Narrative   Not on file   Social Determinants of Health   Financial Resource Strain: Low Risk    Difficulty of Paying Living Expenses: Not hard at all  Food Insecurity: No Food Insecurity   Worried About Running Out of Food in the Last Year: Never true   [place] in the Last Year: Never true  Transportation Needs: No Transportation Needs   Lack of Transportation (Medical): No   Lack of Transportation (Non-Medical): No  Physical Activity: Sufficiently Active   Days of Exercise per Week: 6 days   Minutes of Exercise per Session: 30 min  Stress: No Stress Concern Present   Feeling of Stress : Not at all  Social Connections: Moderately Isolated   Frequency of Communication with Friends and Family: More than three times a week   Frequency of Social Gatherings with Friends and Family: Three times a week   Attends Religious Services: More than 4 times per year   Active Member of Clubs or Organizations: No   Attends Archivist Meetings: Never   Marital Status: Widowed  Human resources officer Violence: Not At Risk   Fear of Current or Ex-Partner: No   Emotionally Abused: No   Physically Abused: No   Sexually Abused: No    Outpatient Medications Prior to Visit  Medication Sig Dispense Refill   amLODipine (NORVASC) 5 MG tablet Take 5 mg by mouth daily.     aspirin 81 MG tablet Take 81 mg by mouth daily.     calcium carbonate (OS-CAL) 600 MG TABS tablet Take  600 mg by mouth every evening.      Multiple Vitamins-Minerals (CENTRUM ADULTS PO) Take 1 tablet by mouth every evening.      No facility-administered medications prior to visit.    Allergies  Allergen Reactions   Penicillins Swelling    Swelling mouth/ tongue    Zetia [Ezetimibe] Swelling    Eyes puffy, tongue swelling   Statins     ROS Review of Systems  Constitutional:  Negative for chills and fever.  HENT:  Negative for congestion, sinus pressure, sinus pain and sore throat.   Eyes:  Negative for pain and discharge.  Respiratory:  Negative for cough and shortness of breath.   Cardiovascular:  Negative for chest pain and palpitations.  Gastrointestinal:  Negative for abdominal pain, constipation, diarrhea, nausea and vomiting.  Endocrine: Negative for polydipsia  and polyuria.  Genitourinary:  Negative for dysuria and hematuria.  Musculoskeletal:  Negative for neck pain and neck stiffness.  Skin:  Negative for rash.  Neurological:  Negative for dizziness and weakness.  Psychiatric/Behavioral:  Negative for agitation and behavioral problems.      Objective:    Physical Exam Vitals reviewed.  Constitutional:      General: She is not in acute distress.    Appearance: She is not diaphoretic.  HENT:     Head: Normocephalic and atraumatic.     Nose: Nose normal.     Mouth/Throat:     Mouth: Mucous membranes are moist.  Eyes:     General: No scleral icterus.    Extraocular Movements: Extraocular movements intact.  Cardiovascular:     Rate and Rhythm: Normal rate and regular rhythm.     Pulses: Normal pulses.     Heart sounds: Murmur (Systolic, most profound at left upper sternal border) heard.  Pulmonary:     Breath sounds: Normal breath sounds. No wheezing or rales.  Abdominal:     Palpations: Abdomen is soft.     Tenderness: There is no abdominal tenderness.  Musculoskeletal:     Cervical back: Neck supple. No tenderness.     Right lower leg: No edema.     Left lower leg: No edema.  Skin:    General: Skin is warm.     Findings: No rash.  Neurological:     General: No focal deficit present.     Mental Status: She is alert and oriented to person, place, and time.     Cranial Nerves: No cranial nerve deficit.     Sensory: No sensory deficit.     Motor: No weakness.  Psychiatric:        Mood and Affect: Mood normal.        Behavior: Behavior normal.    BP 140/88 (BP Location: Left Arm, Patient Position: Sitting, Cuff Size: Normal)   Pulse 73   Temp 97.9 F (36.6 C) (Oral)   Resp 16   Ht 5' 7.5" (1.715 m)   Wt 177 lb 0.6 oz (80.3 kg)   SpO2 96%   BMI 27.32 kg/m  Wt Readings from Last 3 Encounters:  09/03/21 177 lb 0.6 oz (80.3 kg)  02/26/21 181 lb 1.9 oz (82.2 kg)  09/15/20 175 lb (79.4 kg)    Lab Results  Component  Value Date   TSH 3.050 09/03/2021   Lab Results  Component Value Date   WBC 5.1 09/03/2021   HGB 13.4 09/03/2021   HCT 40.4 09/03/2021   MCV 92 09/03/2021   PLT 230 09/03/2021   Lab  Results  Component Value Date   NA 143 09/03/2021   K 4.7 09/03/2021   CO2 24 09/03/2021   GLUCOSE 96 09/03/2021   BUN 14 09/03/2021   CREATININE 0.96 09/03/2021   BILITOT 0.4 09/03/2021   ALKPHOS 90 09/03/2021   AST 19 09/03/2021   ALT 13 09/03/2021   PROT 7.2 09/03/2021   ALBUMIN 4.5 09/03/2021   CALCIUM 9.6 09/03/2021   ANIONGAP 6 07/01/2018   EGFR 62 09/03/2021   Lab Results  Component Value Date   CHOL 291 (H) 09/03/2021   Lab Results  Component Value Date   HDL 74 09/03/2021   Lab Results  Component Value Date   LDLCALC 202 (H) 09/03/2021   Lab Results  Component Value Date   TRIG 92 09/03/2021   Lab Results  Component Value Date   CHOLHDL 3.9 09/03/2021   Lab Results  Component Value Date   HGBA1C 6.0 (H) 09/03/2021      Assessment & Plan:   Problem List Items Addressed This Visit       Encounter for general adult medical examination with abnormal findings - Primary   Annual exam as documented. Counseling done  re healthy lifestyle involving commitment to 150 minutes exercise per week, heart healthy diet, and attaining healthy weight.The importance of adequate sleep also discussed. Changes in health habits are decided on by the patient with goals and time frames  set for achieving them. Immunization and cancer screening needs are specifically addressed at this visit.     Relevant Orders  TSH (Completed)  Lipid panel (Completed)  Hemoglobin A1c (Completed)  CMP14+EGFR (Completed)  CBC with Differential/Platelet (Completed)            Hyperlipidemia Statin-induced myositis    Last lipid profile reviewed Did not tolerate statin and Zetia in the past Continue to follow low carb and low cholesterol diet      Relevant Orders   Lipid panel (Completed)    Systolic murmur    Discussed that most of the systolic murmurs are benign in nature Referred to cardiology for possible echo eval She is currently asymptomatic.      Relevant Orders   Ambulatory referral to Cardiology   Other Visit Diagnoses     Vitamin D deficiency       Relevant Orders   VITAMIN D 25 Hydroxy (Vit-D Deficiency, Fractures) (Completed)   Need for immunization against influenza       Relevant Orders   Flu Vaccine QUAD High Dose(Fluad) (Completed)       No orders of the defined types were placed in this encounter.   Follow-up: Return in about 6 months (around 03/03/2022) for HTN.    Lindell Spar, MD

## 2021-09-07 NOTE — Chronic Care Management (AMB) (Signed)
  Chronic Care Management   Outreach Note  09/07/2021 Name: Tonya Pacheco MRN: [id number] DOB: [birth year]  Tonya Pacheco is a 75 y.o. year old female who is a primary care patient of Anabel Halon, MD. I reached out to Tonya Pacheco by phone today in response to a referral sent by Ms. Gloris [place] primary care provider.  An unsuccessful telephone outreach was attempted today. The patient was referred to the case management team for assistance with care management and care coordination.   Follow Up Plan: A HIPAA compliant phone message was left for the patient providing contact information and requesting a return call. The care management team will reach out to the patient again over the next 7 days. If patient returns call to provider office, please advise to call Embedded Care Management Care Guide Misty Stanley at [phone].  Gwenevere Ghazi  Care Guide, Embedded Care Coordination [facility]  Direct Dial: [phone]

## 2021-09-07 NOTE — Assessment & Plan Note (Signed)
Discussed that most of the systolic murmurs are benign in nature Referred to cardiology for possible echo eval She is currently asymptomatic.

## 2021-09-10 NOTE — Chronic Care Management (AMB) (Signed)
  Chronic Care Management   Note  09/10/2021 Name: Tonya Pacheco MRN: [id number] DOB: [birth year]  Tonya Pacheco is a 75 y.o. year old female who is a primary care patient of Lindell Spar, MD. I reached out to [name] by phone today in response to a referral sent by Ms. Ardelle Park PCP.  [name] was given information about Chronic Care Management services today including:  CCM service includes personalized support from designated clinical staff supervised by her physician, including individualized plan of care and coordination with other care providers 24/7 contact phone numbers for assistance for urgent and routine care needs. Service will only be billed when office clinical staff spend 20 minutes or more in a month to coordinate care. Only one practitioner may furnish and bill the service in a calendar month. The patient may stop CCM services at any time (effective at the end of the month) by phone call to the office staff. The patient is responsible for co-pay (up to 20% after annual deductible is met) if co-pay is required by the individual health plan.   Patient agreed to services and verbal consent obtained.   Follow up plan: Telephone appointment with care management team member scheduled for:11/06/21  [facility] [place]  Direct Dial: [phone]

## 2021-09-23 ENCOUNTER — Other Ambulatory Visit: Payer: Self-pay | Admitting: Internal Medicine

## 2021-09-24 MED ORDER — AMLODIPINE BESYLATE 5 MG PO TABS
5.0000 mg | ORAL_TABLET | Freq: Every day | ORAL | 0 refills | Status: DC
Start: 2021-09-24 — End: 2021-12-17

## 2021-10-29 NOTE — Progress Notes (Signed)
Cardiology Office Note:    Date:  11/02/2021   ID:  Tonya Pacheco, DOB 1945-12-08, MRN [id number]  PCP:  Anabel HalonPatel, Rutwik K, MD  Cardiologist:  None  Electrophysiologist:  None   Referring MD: Anabel HalonPatel, Rutwik K, MD   Chief Complaint  Patient presents with   Hyperlipidemia    History of Present Illness:    Tonya Pacheco is a 76 y.o. female with a hx of hypertension, hyperlipidemia who is referred by Dr. Allena KatzPatel for evaluation of heart murmur.  She reports that she is doing well.  Denies any chest pain, dyspnea, lightheadedness, syncope, lower extremity edema, or palpitations.  She does yard work for exercise.  Frequently will walk up to 15,000 steps per day.  She reports intolerance to multiple statins due to muscle aches.  No smoking history.  No history of heart disease in immediate family.   Past Medical History:  Diagnosis Date   Hypertension     Past Surgical History:  Procedure Laterality Date   COLONOSCOPY WITH PROPOFOL N/A 09/15/2020   Procedure: COLONOSCOPY WITH PROPOFOL;  Surgeon: Dolores Frameastaneda Mayorga, Daniel, MD;  Location: [facility];  Service: Gastroenterology;  Laterality: N/A;  915    Current Medications: Current Meds  Medication Sig   amLODipine (NORVASC) 5 MG tablet Take 1 tablet (5 mg total) by mouth daily.   aspirin 81 MG tablet Take 81 mg by mouth daily.   calcium carbonate (OS-CAL) 600 MG TABS tablet Take 600 mg by mouth every evening.    Cholecalciferol (D3 2000 PO) Take by mouth daily.   Multiple Vitamins-Minerals (CENTRUM ADULTS PO) Take 1 tablet by mouth every evening.      Allergies:   Penicillins, Zetia [ezetimibe], and Statins   Social History   Socioeconomic History   Marital status: Widowed    Spouse name: Not on file   Number of children: Not on file   Years of education: Not on file   Highest education level: Not on file  Occupational History   Not on file  Tobacco Use   Smoking status: Never   Smokeless tobacco: Never  Vaping Use   Vaping  Use: Never used  Substance and Sexual Activity   Alcohol use: No   Drug use: No   Sexual activity: Not Currently  Other Topics Concern   Not on file  Social History Narrative   Not on file   Social Determinants of Health   Financial Resource Strain: Low Risk    Difficulty of Paying Living Expenses: Not hard at all  Food Insecurity: No Food Insecurity   Worried About Running Out of Food in the Last Year: Never true   Ran Out of Food in the Last Year: Never true  Transportation Needs: No Transportation Needs   Lack of Transportation (Medical): No   Lack of Transportation (Non-Medical): No  Physical Activity: Sufficiently Active   Days of Exercise per Week: 6 days   Minutes of Exercise per Session: 30 min  Stress: No Stress Concern Present   Feeling of Stress : Not at all  Social Connections: Moderately Isolated   Frequency of Communication with Friends and Family: More than three times a week   Frequency of Social Gatherings with Friends and Family: Three times a week   Attends Religious Services: More than 4 times per year   Active Member of Clubs or Organizations: No   Attends BankerClub or Organization Meetings: Never   Marital Status: Widowed     Family History: The patient's  family history includes Arthritis (age of onset: 13) in her sister; Cancer in her maternal grandmother; Heart disease in her sister; Kidney disease (age of onset: 24) in her brother; Lupus in her sister.  ROS:   Please see the history of present illness.     All other systems reviewed and are negative.  EKGs/Labs/Other Studies Reviewed:    The following studies were reviewed today:   EKG:  EKG is  ordered today.  The ekg ordered today demonstrates normal sinus rhythm, rate 73, no ST abnormalities  Recent Labs: 09/03/2021: ALT 13; BUN 14; Creatinine, Ser 0.96; Hemoglobin 13.4; Platelets 230; Potassium 4.7; Sodium 143; TSH 3.050  Recent Lipid Panel    Component Value Date/Time   CHOL 291 (H)  09/03/2021 0956   TRIG 92 09/03/2021 0956   HDL 74 09/03/2021 0956   CHOLHDL 3.9 09/03/2021 0956   CHOLHDL 4.1 03/29/2020 0832   VLDL 18 07/30/2016 1007   LDLCALC 202 (H) 09/03/2021 0956   LDLCALC 201 (H) 03/29/2020 9892    Physical Exam:    VS:  BP 132/76 (BP Location: Left Arm)    Pulse 84    Ht 5\' 8"  (1.727 m)    Wt 175 lb (79.4 kg)    SpO2 98%    BMI 26.61 kg/m     Wt Readings from Last 3 Encounters:  11/02/21 175 lb (79.4 kg)  09/03/21 177 lb 0.6 oz (80.3 kg)  02/26/21 181 lb 1.9 oz (82.2 kg)     GEN:  Well nourished, well developed in no acute distress HEENT: Normal NECK: No JVD; No carotid bruits LYMPHATICS: No lymphadenopathy CARDIAC: RRR, 2 out of 6 systolic murmur RESPIRATORY:  Clear to auscultation without rales, wheezing or rhonchi  ABDOMEN: Soft, non-tender, non-distended MUSCULOSKELETAL:  No edema; No deformity  SKIN: Warm and dry NEUROLOGIC:  Alert and oriented x 3 PSYCHIATRIC:  Normal affect   ASSESSMENT:    1. Heart murmur   2. Essential hypertension, benign   3. Hyperlipidemia, unspecified hyperlipidemia type    PLAN:    Heart murmur: 2 out of 6 systolic murmur.  Will check echocardiogram  Hypertension: On amlodipine 5 mg daily.  Appears controlled.   Hyperlipidemia: Did not tolerate statin or Zetia.  LDL 202 on 09/03/2021 -Likely FH, recommended referral to lipid clinic for PCSK9 inhibitor.  She reports she has appointment next week with pharmacist at her PCP to start PCSK9 inhibitor -Check calcium score   RTC in 6 months  Medication Adjustments/Labs and Tests Ordered: Current medicines are reviewed at length with the patient today.  Concerns regarding medicines are outlined above.  Orders Placed This Encounter  Procedures   CT CARDIAC SCORING (SELF PAY ONLY)   EKG 12-Lead   ECHOCARDIOGRAM COMPLETE   No orders of the defined types were placed in this encounter.   Patient Instructions  Medication Instructions:  Your physician  recommends that you continue on your current medications as directed. Please refer to the Current Medication list given to you today.  *If you need a refill on your cardiac medications before your next appointment, please call your pharmacy*   Lab Work: None If you have labs (blood work) drawn today and your tests are completely normal, you will receive your results only by: MyChart Message (if you have MyChart) OR A paper copy in the mail If you have any lab test that is abnormal or we need to change your treatment, we will call you to review the results.  Testing/Procedures: Your physician has requested that you have an echocardiogram. Echocardiography is a painless test that uses sound waves to create images of your heart. It provides your doctor with information about the size and shape of your heart and how well your hearts chambers and valves are working. This procedure takes approximately one hour. There are no restrictions for this procedure.  Calcium Score- Self Pay $99.00    Follow-Up: At [facility], you and your health needs are our priority.  As part of our continuing mission to provide you with exceptional heart care, we have created designated Provider Care Teams.  These Care Teams include your primary Cardiologist (physician) and Advanced Practice Providers (APPs -  Physician Assistants and Nurse Practitioners) who all work together to provide you with the care you need, when you need it.  We recommend signing up for the patient portal called "MyChart".  Sign up information is provided on this After Visit Summary.  MyChart is used to connect with patients for Virtual Visits (Telemedicine).  Patients are able to view lab/test results, encounter notes, upcoming appointments, etc.  Non-urgent messages can be sent to your provider as well.   To learn more about what you can do with MyChart, go to [email or web address].    Your next appointment:   6 month(s)  The format  for your next appointment:   In Person  Provider:   Epifanio Lesches, MD     Other Instructions      Signed, Little Ishikawa, MD  11/02/2021 10:20 AM    [facility]

## 2021-11-02 ENCOUNTER — Ambulatory Visit: Payer: Medicare Other | Admitting: Cardiology

## 2021-11-02 ENCOUNTER — Encounter: Payer: Self-pay | Admitting: Cardiology

## 2021-11-02 ENCOUNTER — Other Ambulatory Visit: Payer: Self-pay

## 2021-11-02 VITALS — BP 132/76 | HR 84 | Ht 68.0 in | Wt 175.0 lb

## 2021-11-02 DIAGNOSIS — E785 Hyperlipidemia, unspecified: Secondary | ICD-10-CM | POA: Diagnosis not present

## 2021-11-02 DIAGNOSIS — I10 Essential (primary) hypertension: Secondary | ICD-10-CM

## 2021-11-02 DIAGNOSIS — R011 Cardiac murmur, unspecified: Secondary | ICD-10-CM | POA: Diagnosis not present

## 2021-11-02 NOTE — Patient Instructions (Signed)
Medication Instructions:  Your physician recommends that you continue on your current medications as directed. Please refer to the Current Medication list given to you today.  *If you need a refill on your cardiac medications before your next appointment, please call your pharmacy*   Lab Work: None If you have labs (blood work) drawn today and your tests are completely normal, you will receive your results only by: MyChart Message (if you have MyChart) OR A paper copy in the mail If you have any lab test that is abnormal or we need to change your treatment, we will call you to review the results.   Testing/Procedures: Your physician has requested that you have an echocardiogram. Echocardiography is a painless test that uses sound waves to create images of your heart. It provides your doctor with information about the size and shape of your heart and how well your hearts chambers and valves are working. This procedure takes approximately one hour. There are no restrictions for this procedure.  Calcium Score- Self Pay $99.00    Follow-Up: At [facility], you and your health needs are our priority.  As part of our continuing mission to provide you with exceptional heart care, we have created designated Provider Care Teams.  These Care Teams include your primary Cardiologist (physician) and Advanced Practice Providers (APPs -  Physician Assistants and Nurse Practitioners) who all work together to provide you with the care you need, when you need it.  We recommend signing up for the patient portal called "MyChart".  Sign up information is provided on this After Visit Summary.  MyChart is used to connect with patients for Virtual Visits (Telemedicine).  Patients are able to view lab/test results, encounter notes, upcoming appointments, etc.  Non-urgent messages can be sent to your provider as well.   To learn more about what you can do with MyChart, go to [email or web address].    Your next  appointment:   6 month(s)  The format for your next appointment:   In Person  Provider:   Epifanio Lesches, MD     Other Instructions

## 2021-11-06 ENCOUNTER — Ambulatory Visit (INDEPENDENT_AMBULATORY_CARE_PROVIDER_SITE_OTHER): Payer: Medicare Other | Admitting: Pharmacist

## 2021-11-06 ENCOUNTER — Other Ambulatory Visit: Payer: Self-pay

## 2021-11-06 DIAGNOSIS — I10 Essential (primary) hypertension: Secondary | ICD-10-CM

## 2021-11-06 DIAGNOSIS — E7800 Pure hypercholesterolemia, unspecified: Secondary | ICD-10-CM

## 2021-11-06 DIAGNOSIS — M8589 Other specified disorders of bone density and structure, multiple sites: Secondary | ICD-10-CM

## 2021-11-06 MED ORDER — REPATHA SURECLICK 140 MG/ML ~~LOC~~ SOAJ: 140.0000 mg | SUBCUTANEOUS | 11 refills | Status: DC

## 2021-11-06 NOTE — Chronic Care Management (AMB) (Signed)
Chronic Care Management Pharmacy Note  11/06/2021 Name:  Tonya Pacheco MRN:  [id number] DOB:  Dec 22, 1945  Summary: Hyperlipidemia Follows with Dr. Bjorn Pippin for heart murmur Uncontrolled. LDL above goal of <70 due to possible familial hypercholesterolemia and very high risk given 10-year risk >20% per 2020 AACE/ACE guidelines. Triglycerides at goal of <150 per 2020 AACE/ACE guidelines. On physical exam, patient has corneal arcus present. No tendon xanthomas or xanthelasma. Patient reports a strong family history of high cholesterol. No known family history of cardiovascular events. Likely has familial hypercholesterolemia. Intolerances:  statins (patient unsure which she has tried before) caused muscle/joint aches and ezetimibe caused swelling of eyes/tongue Continue follow-up with cardiology Due to likely, familial hypercholesterolemia, will add evolocumab (Repatha) 140 mg subcutaneously every 2 weeks. Repatha chosen per [employer]. Prior authorization completed today via CoverMyMeds (Key: [id number]). Patient will call insurance to determine if she is able to afford the co-pay. She will also check to see if she can get cheaper as 90 day supply vs 30 day supply. Anticipate cost concerns with Medicare coverage gap later in the year. Discussed Praluent patient assistance program and requirements which patient is borderline (income). May be able to assist with grant funding [facility]). Hyperlipidemia fund currently closed but could consider adding patient to wait list.  Will discuss when to initiate therapy with cardiology because patient is currently schedules for CAC score in Feb and initiation of therapy may impact results.  Re-check lipid panel 4-12 weeks after initiation of PCSK9i.   Subjective: Tonya Pacheco is an 76 y.o. year old female who is a primary patient of Anabel Halon, MD.  The CCM team was consulted for assistance with disease management and care coordination needs.     Engaged with patient face to face for initial visit in response to provider referral for pharmacy case management and/or care coordination services.   Consent to Services:  The patient was given the following information about Chronic Care Management services today, agreed to services, and gave verbal consent: 1. CCM service includes personalized support from designated clinical staff supervised by the primary care provider, including individualized plan of care and coordination with other care providers 2. 24/7 contact phone numbers for assistance for urgent and routine care needs. 3. Service will only be billed when office clinical staff spend 20 minutes or more in a month to coordinate care. 4. Only one practitioner may furnish and bill the service in a calendar month. 5.The patient may stop CCM services at any time (effective at the end of the month) by phone call to the office staff. 6. The patient will be responsible for cost sharing (co-pay) of up to 20% of the service fee (after annual deductible is met). Patient agreed to services and consent obtained.  Patient Care Team: Anabel Halon, MD as PCP - General (Internal Medicine) Gavin Pound, [facility] (Pharmacist)  Objective:  Lab Results  Component Value Date   CREATININE 0.96 09/03/2021   CREATININE 1.07 (H) 03/29/2020   CREATININE 0.97 (H) 01/05/2019    Lab Results  Component Value Date   HGBA1C 6.0 (H) 09/03/2021   Last diabetic Eye exam: No results found for: HMDIABEYEEXA  Last diabetic Foot exam: No results found for: HMDIABFOOTEX      Component Value Date/Time   CHOL 291 (H) 09/03/2021 0956   TRIG 92 09/03/2021 0956   HDL 74 09/03/2021 0956   CHOLHDL 3.9 09/03/2021 0956   CHOLHDL 4.1 03/29/2020 0832   VLDL 18  07/30/2016 1007   LDLCALC 202 (H) 09/03/2021 0956   LDLCALC 201 (H) 03/29/2020 0832    Hepatic Function Latest Ref Rng & Units 09/03/2021 03/29/2020 01/05/2019  Total Protein 6.0 - 8.5 g/dL 7.2 7.0 7.0   Albumin 3.7 - 4.7 g/dL 4.5 - -  AST 0 - 40 IU/L $Remov'19 16 18  'zDOITi$ ALT 0 - 32 IU/L $Remov'13 12 12  'JvsaLt$ Alk Phosphatase 44 - 121 IU/L 90 - -  Total Bilirubin 0.0 - 1.2 mg/dL 0.4 0.5 0.5    Lab Results  Component Value Date/Time   TSH 3.050 09/03/2021 09:56 AM   TSH 1.961 03/06/2015 10:21 AM    CBC Latest Ref Rng & Units 09/03/2021 03/29/2020 01/05/2019  WBC 3.4 - 10.8 x10E3/uL 5.1 5.0 5.0  Hemoglobin 11.1 - 15.9 g/dL 13.4 13.7 13.0  Hematocrit 34.0 - 46.6 % 40.4 40.4 39.8  Platelets 150 - 450 x10E3/uL 230 232 228    Lab Results  Component Value Date/Time   VD25OH 27.7 (L) 09/03/2021 09:56 AM   VD25OH 31 07/30/2016 10:07 AM    Clinical ASCVD: No  The 10-year ASCVD risk score (Arnett DK, et al., 2019) is: 21.6%   Values used to calculate the score:     Age: 39 years     Sex: Female     Is Non-Hispanic African American: Yes     Diabetic: No     Tobacco smoker: No     Systolic Blood Pressure: 981 mmHg     Is BP treated: Yes     HDL Cholesterol: 74 mg/dL     Total Cholesterol: 291 mg/dL    Social History   Tobacco Use  Smoking Status Never  Smokeless Tobacco Never   BP Readings from Last 3 Encounters:  11/02/21 132/76  09/03/21 140/88  02/26/21 131/78   Pulse Readings from Last 3 Encounters:  11/02/21 84  09/03/21 73  02/26/21 76   Wt Readings from Last 3 Encounters:  11/02/21 175 lb (79.4 kg)  09/03/21 177 lb 0.6 oz (80.3 kg)  02/26/21 181 lb 1.9 oz (82.2 kg)    Assessment: Review of patient past medical history, allergies, medications, health status, including review of consultants reports, laboratory and other test data, was performed as part of comprehensive evaluation and provision of chronic care management services.   SDOH:  (Social Determinants of Health) assessments and interventions performed:  SDOH Interventions    Flowsheet Row Most Recent Value  SDOH Interventions   SDOH Interventions for the Following Domains Financial Strain  Financial Strain Interventions  Other (Comment)  [Medication Assistance Program Evaluation Completed]       CCM Care Plan  Allergies  Allergen Reactions   Penicillins Swelling    Swelling mouth/ tongue    Zetia [Ezetimibe] Swelling    Eyes puffy, tongue swelling   Statins Other (See Comments)    Muscle/joint aches    Medications Reviewed Today     Reviewed by Beryle Lathe, [place] (Pharmacist) on 11/06/21 at 1049  Med List Status: <None>   Medication Order Taking? Sig Documenting Provider Last Dose Status Informant  amLODipine (NORVASC) 5 MG tablet [id number] Yes Take 1 tablet (5 mg total) by mouth daily. Lindell Spar, MD Taking Active   aspirin 81 MG tablet [id number] Yes Take 81 mg by mouth daily. [provider] Taking Active Self  calcium carbonate (OS-CAL) 600 MG TABS tablet [id number] Yes Take 600 mg by mouth every evening.  [provider] Taking Active Self  Cholecalciferol (  D3 2000 PO) [id number] Yes Take 2,000 Units by mouth daily. [provider] Taking Active Self  Multiple Vitamins-Minerals (CENTRUM ADULTS PO) [id number] Yes Take 1 tablet by mouth every evening.  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   Osteopenia 07/30/2016   Hyperlipidemia 09/06/2015   Essential hypertension, benign 03/06/2015   Seasonal allergies 03/06/2015    Immunization History  Administered Date(s) Administered   Fluad Quad(high Dose 65+) 09/03/2021   Moderna SARS-COV2 Booster Vaccination 10/02/2020   Lindsay Sars-Covid-2 Vaccination 01/06/2020, 02/05/2020   Pneumococcal Conjugate-13 03/06/2015   Pneumococcal Polysaccharide-23 07/30/2016    Conditions to be addressed/monitored: HTN, HLD, and Osteopenia  Care Plan : Medication Management  Updates made by  Beryle Lathe, [place] since 11/06/2021 12:00 AM     Problem: HLD, HTN, Osteopenia   Priority: High  Onset Date: 11/06/2021     Long-Range Goal: Disease Progression Prevention   Start Date: 11/06/2021  Expected End Date: 02/04/2022  This Visit's Progress: On track  Priority: High  Note:   Current Barriers:  Unable to independently monitor therapeutic efficacy Unable to achieve control of hyperlipidemia  Pharmacist Clinical Goal(s):  Through collaboration with PharmD and provider, patient will  Achieve adherence to monitoring guidelines and medication adherence to achieve therapeutic efficacy Achieve control of hyperlipidemia as evidenced by improved LDL   Interventions: 1:1 collaboration with Lindell Spar, MD regarding development and update of comprehensive plan of care as evidenced by provider attestation and co-signature Inter-disciplinary care team collaboration (see longitudinal plan of care) Comprehensive medication review performed; medication list updated in electronic medical record  Hypertension - Condition stable. Not addressed this visit.: Blood pressure under good control. Blood pressure is at goal of <130/80 mmHg per 2017 AHA/ACC guidelines. Current medications: amlodipine 5 mg by mouth once daily Intolerances: none Taking medications as directed: yes Side effects thought to be attributed to current medication regimen: no Denies dizziness and lightheadedness. Current exercise: yard work Current home blood pressure: not discussed today Continue current medications as above Encourage dietary sodium restriction/DASH diet Recommend regular aerobic exercise Recommend home blood pressure monitoring to discuss at next visit  Hyperlipidemia - New goal.: Follows with Dr. Gardiner Rhyme for heart murmur Uncontrolled. LDL above goal of <70 due to possible familial hypercholesterolemia and very high risk given 10-year risk >20% per 2020 AACE/ACE guidelines. Triglycerides  at goal of <150 per 2020 AACE/ACE guidelines. On physical exam, patient has corneal arcus present. No tendon xanthomas or xanthelasma. Patient reports a strong family history of high cholesterol. No known family history of cardiovascular events. Likely has familial hypercholesterolemia. Current medications:  none Intolerances:  statins (patient unsure which she has tried before) caused muscle/joint aches and ezetimibe caused swelling of eyes/tongue Taking medications as directed: n/a Side effects thought to be attributed to current medication regimen: n/a Encourage dietary reduction of high fat containing foods such as butter, nuts, bacon, egg yolks, etc. Recommend regular aerobic exercise Reviewed risks of hyperlipidemia, principles of treatment and consequences of untreated hyperlipidemia Statin intolerance noted. No statin due to serious side effects (ex. Myalgias with at least 2 different statins) Patient was instructed on appropriate self administration technique for injection of evolocumab (Repatha) Continue follow-up with cardiology Due to likely, familial hypercholesterolemia, will add evolocumab (Repatha) 140  mg subcutaneously every 2 weeks. Repatha chosen per [employer]. Prior authorization completed today via CoverMyMeds (Key: [id number]). Patient will call insurance to determine if she is able to afford the co-pay. She will also check to see if she can get cheaper as 90 day supply vs 30 day supply. Anticipate cost concerns with Medicare coverage gap later in the year. Discussed Praluent patient assistance program and requirements which patient is borderline (income). May be able to assist with grant funding [facility]). Hyperlipidemia fund currently closed but could consider adding patient to wait list.  Will discuss when to initiate therapy with cardiology because patient is currently schedules for CAC score in Feb and initiation of therapy may impact results.  Re-check lipid  panel 4-12 weeks after initiation of PCSK9i.   Osteopenia - New goal.: Appropriately managed Current medications: calcium + vitamin D once daily Intolerances: none Taking medications as directed: yes Side effects thought to be attributed to current medication regimen: no Last DEXA (08/02/20): Spine L1-L4 T -2.0, total R femur T -2.0, femur total mean T -1.9 Falls reported in last year: 0 Current exercise: not discussed today Recommend 1,000 mg of elemental calcium per day from diet or supplementation. Supplemental calcium not necessary if appropriate amount obtained through diet. Recommend 800 - 2,000 IU of vitamin D supplementation per day to maintain adequate vitamin D levels Recommend walking, low impact aerobic exercise, or strength training  Patient Goals/Self-Care Activities Patient will:  Take medications as prescribed Check blood pressure at least once daily, document, and provide at future appointments Collaborate with provider on medication access solutions Target a minimum of 150 minutes of moderate intensity exercise weekly  Follow Up Plan: Telephone follow up appointment with care management team member scheduled for: 11/14/21      Medication Assistance:  TBD  Patient's preferred pharmacy is:  [facility] [place], [place] [facility] [address] [place] [address] Phone: [phone] Fax: [phone]  Follow Up:  Patient agrees to Care Plan and Follow-up.  Plan: Telephone follow up appointment with care management team member scheduled for:  11/14/21  Kennon Holter, PharmD, [place], [place] Clinical Pharmacist Practitioner [facility] [phone]

## 2021-11-06 NOTE — Patient Instructions (Signed)
Tonya Pacheco,  It was great to talk to you today!  Please call me with any questions or concerns.   Visit Information   Following is a copy of your full care plan:  Care Plan : Medication Management  Updates made by Beryle Lathe, [place] since 11/06/2021 12:00 AM     Problem: HLD, HTN, Osteopenia   Priority: High  Onset Date: 11/06/2021     Long-Range Goal: Disease Progression Prevention   Start Date: 11/06/2021  Expected End Date: 02/04/2022  This Visit's Progress: On track  Priority: High  Note:   Current Barriers:  Unable to independently monitor therapeutic efficacy Unable to achieve control of hyperlipidemia  Pharmacist Clinical Goal(s):  Through collaboration with PharmD and provider, patient will  Achieve adherence to monitoring guidelines and medication adherence to achieve therapeutic efficacy Achieve control of hyperlipidemia as evidenced by improved LDL   Interventions: 1:1 collaboration with Lindell Spar, MD regarding development and update of comprehensive plan of care as evidenced by provider attestation and co-signature Inter-disciplinary care team collaboration (see longitudinal plan of care) Comprehensive medication review performed; medication list updated in electronic medical record  Hypertension - Condition stable. Not addressed this visit.: Blood pressure under good control. Blood pressure is at goal of <130/80 mmHg per 2017 AHA/ACC guidelines. Current medications: amlodipine 5 mg by mouth once daily Intolerances: none Taking medications as directed: yes Side effects thought to be attributed to current medication regimen: no Denies dizziness and lightheadedness. Current exercise: yard work Current home blood pressure: not discussed today Continue current medications as above Encourage dietary sodium restriction/DASH diet Recommend regular aerobic exercise Recommend home blood pressure monitoring to discuss at next visit  Hyperlipidemia -  New goal.: Follows with Dr. Gardiner Rhyme for heart murmur Uncontrolled. LDL above goal of <70 due to possible familial hypercholesterolemia and very high risk given 10-year risk >20% per 2020 AACE/ACE guidelines. Triglycerides at goal of <150 per 2020 AACE/ACE guidelines. On physical exam, patient has corneal arcus present. No tendon xanthomas or xanthelasma. Patient reports a strong family history of high cholesterol. No known family history of cardiovascular events. Likely has familial hypercholesterolemia. Current medications:  none Intolerances:  statins (patient unsure which she has tried before) caused muscle/joint aches and ezetimibe caused swelling of eyes/tongue Taking medications as directed: n/a Side effects thought to be attributed to current medication regimen: n/a Encourage dietary reduction of high fat containing foods such as butter, nuts, bacon, egg yolks, etc. Recommend regular aerobic exercise Reviewed risks of hyperlipidemia, principles of treatment and consequences of untreated hyperlipidemia Statin intolerance noted. No statin due to serious side effects (ex. Myalgias with at least 2 different statins) Patient was instructed on appropriate self administration technique for injection of evolocumab (Repatha) Continue follow-up with cardiology Due to likely, familial hypercholesterolemia, will add evolocumab (Repatha) 140 mg subcutaneously every 2 weeks. Repatha chosen per [employer]. Prior authorization completed today via CoverMyMeds (Key: [id number]). Patient will call insurance to determine if she is able to afford the co-pay. She will also check to see if she can get cheaper as 90 day supply vs 30 day supply. Anticipate cost concerns with Medicare coverage gap later in the year. Discussed Praluent patient assistance program and requirements which patient is borderline (income). May be able to assist with grant funding [facility]). Hyperlipidemia fund currently closed but  could consider adding patient to wait list.  Will discuss when to initiate therapy with cardiology because patient is currently schedules for CAC score in Feb and  initiation of therapy may impact results.  Re-check lipid panel 4-12 weeks after initiation of PCSK9i.   Osteopenia - New goal.: Appropriately managed Current medications: calcium + vitamin D once daily Intolerances: none Taking medications as directed: yes Side effects thought to be attributed to current medication regimen: no Last DEXA (08/02/20): Spine L1-L4 T -2.0, total R femur T -2.0, femur total mean T -1.9 Falls reported in last year: 0 Current exercise: not discussed today Recommend 1,000 mg of elemental calcium per day from diet or supplementation. Supplemental calcium not necessary if appropriate amount obtained through diet. Recommend 800 - 2,000 IU of vitamin D supplementation per day to maintain adequate vitamin D levels Recommend walking, low impact aerobic exercise, or strength training  Patient Goals/Self-Care Activities Patient will:  Take medications as prescribed Check blood pressure at least once daily, document, and provide at future appointments Collaborate with provider on medication access solutions Target a minimum of 150 minutes of moderate intensity exercise weekly  Follow Up Plan: Telephone follow up appointment with care management team member scheduled for: 11/14/21      Consent to CCM Services: [name] was given information about Chronic Care Management services including:  CCM service includes personalized support from designated clinical staff supervised by her physician, including individualized plan of care and coordination with other care providers 24/7 contact phone numbers for assistance for urgent and routine care needs. Service will only be billed when office clinical staff spend 20 minutes or more in a month to coordinate care. Only one practitioner may furnish and bill the service  in a calendar month. The patient may stop CCM services at any time (effective at the end of the month) by phone call to the office staff. The patient will be responsible for cost sharing (co-pay) of up to 20% of the service fee (after annual deductible is met).  Patient agreed to services and verbal consent obtained.   Plan: Telephone follow up appointment with care management team member scheduled for:  11/14/21  Kennon Holter, PharmD, BCACP, CPP Clinical Pharmacist Practitioner [facility] [phone]  Please call the care guide team at [phone] if you need to cancel or reschedule your appointment.   Patient verbalizes understanding of instructions provided today and agrees to view in [place].

## 2021-11-14 ENCOUNTER — Ambulatory Visit: Payer: Medicare Other | Admitting: Pharmacist

## 2021-11-14 DIAGNOSIS — I10 Essential (primary) hypertension: Secondary | ICD-10-CM

## 2021-11-14 DIAGNOSIS — M8589 Other specified disorders of bone density and structure, multiple sites: Secondary | ICD-10-CM

## 2021-11-14 DIAGNOSIS — E782 Mixed hyperlipidemia: Secondary | ICD-10-CM

## 2021-11-14 NOTE — Patient Instructions (Signed)
Tonya Pacheco,  It was great to talk to you today!  Please call me with any questions or concerns.  Visit Information  Following are the goals we discussed today:   Goals Addressed             This Visit's Progress    Medication Management       Patient Goals/Self-Care Activities Patient will:  Take medications as prescribed Check blood pressure at least once daily, document, and provide at future appointments Collaborate with provider on medication access solutions Target a minimum of 150 minutes of moderate intensity exercise weekly          Follow-up plan: Telephone follow up appointment with care management team member scheduled for:  03/05/22  Patient verbalizes understanding of instructions and care plan provided today and agrees to view in MyChart. Active MyChart status confirmed with patient.    Please call the care guide team at [phone] if you need to cancel or reschedule your appointment.   Domenic Moras, PharmD, [place], CPP Clinical Pharmacist Practitioner [facility] Primary [facility] [phone]

## 2021-11-14 NOTE — Chronic Care Management (AMB) (Signed)
Chronic Care Management Pharmacy Note  11/14/2021 Name:  Tonya Pacheco MRN:  [id number] DOB:  30-Dec-1945  Summary: Hyperlipidemia Follows with Dr. Gardiner Rhyme for heart murmur. Has echocardiogram scheduled for 11/26/21 and CAC score scheduled for 12/06/21.  Uncontrolled. LDL above goal of <70 due to possible familial hypercholesterolemia and very high risk given 10-year risk >20% per 2020 AACE/ACE guidelines. Triglycerides at goal of <150 per 2020 AACE/ACE guidelines. On physical exam, patient has corneal arcus present. No tendon xanthomas or xanthelasma. Patient reports a strong family history of high cholesterol. No known family history of cardiovascular events. Likely has familial hypercholesterolemia. Current medications: evolocumab (Repatha) 140 mg subcutaneously every 2 weeks Intolerances:  statins (patient unsure which she has tried before) caused muscle/joint aches and ezetimibe caused swelling of eyes/tongue Patient plans to start Disautel on 12/12/21 after CAC score completed since initiation of PCSK9i therapy may impact results Due to likely, familial hypercholesterolemia, will add evolocumab (Repatha) 140 mg subcutaneously every 2 weeks. Cardiology in agreement with plan.  Repatha chosen per [employer]. Repatha Prior auth approved thru 11/06/22. Patient reports copay is affordable ($37/month) and can only get 30 day supply at a time per insurance requirements. If copay becomes an issue will consider HealthWell or [employer] foundation grant or [employer].  Re-check lipid panel 4-12 weeks after initiation of PCSK9i. Order placed today. Anticipate patient will complete late April or early May. Will follow-up on results.   Subjective: Tonya Pacheco is an 76 y.o. year old female who is a primary patient of Lindell Spar, MD.  The CCM team was consulted for assistance with disease management and care coordination needs.    Engaged with patient by telephone for follow up  visit in response to provider referral for pharmacy case management and/or care coordination services.   Consent to Services:  The patient was given information about Chronic Care Management services, agreed to services, and gave verbal consent prior to initiation of services.  Please see initial visit note for detailed documentation.   Patient Care Team: Lindell Spar, MD as PCP - General (Internal Medicine) Beryle Lathe, [facility] (Pharmacist)  Objective:  Lab Results  Component Value Date   CREATININE 0.96 09/03/2021   CREATININE 1.07 (H) 03/29/2020   CREATININE 0.97 (H) 01/05/2019    Lab Results  Component Value Date   HGBA1C 6.0 (H) 09/03/2021   Last diabetic Eye exam: No results found for: HMDIABEYEEXA  Last diabetic Foot exam: No results found for: HMDIABFOOTEX      Component Value Date/Time   CHOL 291 (H) 09/03/2021 0956   TRIG 92 09/03/2021 0956   HDL 74 09/03/2021 0956   CHOLHDL 3.9 09/03/2021 0956   CHOLHDL 4.1 03/29/2020 0832   VLDL 18 07/30/2016 1007   LDLCALC 202 (H) 09/03/2021 0956   LDLCALC 201 (H) 03/29/2020 0832    Hepatic Function Latest Ref Rng & Units 09/03/2021 03/29/2020 01/05/2019  Total Protein 6.0 - 8.5 g/dL 7.2 7.0 7.0  Albumin 3.7 - 4.7 g/dL 4.5 - -  AST 0 - 40 IU/L _0 ALT 0 - 32 IU/L _1 Alk Phosphatase 44 - 121 IU/L 90 - -  Total Bilirubin 0.0 - 1.2 mg/dL 0.4 0.5 0.5    Lab Results  Component Value Date/Time   TSH 3.050 09/03/2021 09:56 AM   TSH 1.961 03/06/2015 10:21 AM    CBC Latest Ref Rng & Units 09/03/2021 03/29/2020 01/05/2019  WBC 3.4 - 10.8 x10E3/uL  5.1 5.0 5.0  Hemoglobin 11.1 - 15.9 g/dL 13.4 13.7 13.0  Hematocrit 34.0 - 46.6 % 40.4 40.4 39.8  Platelets 150 - 450 x10E3/uL 230 232 228    Lab Results  Component Value Date/Time   VD25OH 27.7 (L) 09/03/2021 09:56 AM   VD25OH 31 07/30/2016 10:07 AM    Clinical ASCVD: No  The 10-year ASCVD risk score (Arnett DK, et al., 2019) is: 21.6%   Values used to  calculate the score:     Age: 56 years     Sex: Female     Is Non-Hispanic African American: Yes     Diabetic: No     Tobacco smoker: No     Systolic Blood Pressure: 161 mmHg     Is BP treated: Yes     HDL Cholesterol: 74 mg/dL     Total Cholesterol: 291 mg/dL    Social History   Tobacco Use  Smoking Status Never  Smokeless Tobacco Never   BP Readings from Last 3 Encounters:  11/02/21 132/76  09/03/21 140/88  02/26/21 131/78   Pulse Readings from Last 3 Encounters:  11/02/21 84  09/03/21 73  02/26/21 76   Wt Readings from Last 3 Encounters:  11/02/21 175 lb (79.4 kg)  09/03/21 177 lb 0.6 oz (80.3 kg)  02/26/21 181 lb 1.9 oz (82.2 kg)    Assessment: Review of patient past medical history, allergies, medications, health status, including review of consultants reports, laboratory and other test data, was performed as part of comprehensive evaluation and provision of chronic care management services.   SDOH:  (Social Determinants of Health) assessments and interventions performed:    CCM Care Plan  Allergies  Allergen Reactions   Penicillins Swelling    Swelling mouth/ tongue    Zetia [Ezetimibe] Swelling    Eyes puffy, tongue swelling   Statins Other (See Comments)    Muscle/joint aches    Medications Reviewed Today     Reviewed by Beryle Lathe, [facility] (Pharmacist) on 11/14/21 at [place] Status: <None>   Medication Order Taking? Sig Documenting Provider Last Dose Status Informant  amLODipine (NORVASC) 5 MG tablet [id number] Yes Take 1 tablet (5 mg total) by mouth daily. Lindell Spar, MD Taking Active   aspirin 81 MG tablet [id number] Yes Take 81 mg by mouth daily. [provider] Taking Active Self  calcium carbonate (OS-CAL) 600 MG TABS tablet [id number] Yes Take 600 mg by mouth every evening.  [provider] Taking Active Self  Cholecalciferol (D3 2000 PO) [id number] Yes Take 2,000 Units by mouth daily. [provider]  Taking Active Self  Evolocumab (REPATHA SURECLICK) 528 MG/ML SOAJ [id number] No Inject 140 mg into the skin every 14 (fourteen) days.  Patient not taking: Reported on 11/14/2021   Lindell Spar, MD Not Taking Active            Med Note Vernell Barrier Nov 14, 2021  8:52 AM) Plans to start 12/12/21  Multiple Vitamins-Minerals (CENTRUM ADULTS PO) [id number] Yes Take 1 tablet by mouth every evening.  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   Osteopenia 07/30/2016   Hyperlipidemia 09/06/2015   Essential hypertension, benign 03/06/2015   Seasonal allergies 03/06/2015  Immunization History  Administered Date(s) Administered   Fluad Quad(high Dose 65+) 09/03/2021   Moderna Covid-19 Vaccine Bivalent Booster 40yr & up 11/07/2021   Moderna SARS-COV2 Booster Vaccination 10/02/2020   Moderna Sars-Covid-2 Vaccination 01/06/2020, 02/05/2020   Pneumococcal Conjugate-13 03/06/2015   Pneumococcal Polysaccharide-23 07/30/2016    Conditions to be addressed/monitored: HTN, HLD, and Osteopenia  Care Plan : Medication Management  Updates made by WBeryle Lathe RWest Pel[place] 11/14/2021 12:00 AM     Problem: HLD, HTN, Osteopenia   Priority: High  Onset Date: 11/06/2021     Long-Range Goal: Disease Progression Prevention   Start Date: 11/06/2021  Expected End Date: 02/04/2022  Recent Progress: On track  Priority: High  Note:   Current Barriers:  Unable to independently monitor therapeutic efficacy Unable to achieve control of hyperlipidemia  Pharmacist Clinical Goal(s):  Through collaboration with PharmD and provider, patient will  Achieve adherence to monitoring guidelines and medication adherence to achieve therapeutic efficacy Achieve control of  hyperlipidemia as evidenced by improved LDL   Interventions: 1:1 collaboration with PLindell Spar MD regarding development and update of comprehensive plan of care as evidenced by provider attestation and co-signature Inter-disciplinary care team collaboration (see longitudinal plan of care) Comprehensive medication review performed; medication list updated in electronic medical record  Hypertension - Condition stable. Not addressed this visit.: Blood pressure under good control. Blood pressure is at goal of <130/80 mmHg per 2017 AHA/ACC guidelines. Current medications: amlodipine 5 mg by mouth once daily Intolerances: none Taking medications as directed: yes Side effects thought to be attributed to current medication regimen: no Denies dizziness and lightheadedness. Current exercise: yard work Current home blood pressure: not discussed today Continue current medications as above Encourage dietary sodium restriction/DASH diet Recommend regular aerobic exercise Recommend home blood pressure monitoring to discuss at next visit  Hyperlipidemia - New goal.: Follows with Dr. SGardiner Rhymefor heart murmur. Has echocardiogram scheduled for 11/26/21 and CAC score scheduled for 12/06/21.  Uncontrolled. LDL above goal of <70 due to possible familial hypercholesterolemia and very high risk given 10-year risk >20% per 2020 AACE/ACE guidelines. Triglycerides at goal of <150 per 2020 AACE/ACE guidelines. On physical exam, patient has corneal arcus present. No tendon xanthomas or xanthelasma. Patient reports a strong family history of high cholesterol. No known family history of cardiovascular events. Likely has familial hypercholesterolemia. Current medications: evolocumab (Repatha) 140 mg subcutaneously every 2 weeks Intolerances:  statins (patient unsure which she has tried before) caused muscle/joint aches and ezetimibe caused swelling of eyes/tongue Taking medications as directed: no, plans to start  therapy on 12/12/21 after CAC score completed since initiation of PCSK9i therapy may impact results Side effects thought to be attributed to current medication regimen: n/a Encourage dietary reduction of high fat containing foods such as butter, nuts, bacon, egg yolks, etc. Recommend regular aerobic exercise Reviewed risks of hyperlipidemia, principles of treatment and consequences of untreated hyperlipidemia Statin intolerance noted. No statin due to serious side effects (ex. Myalgias with at least 2 different statins) Patient was instructed on appropriate self administration technique for injection of evolocumab (Repatha) Continue follow-up with cardiology Due to likely, familial hypercholesterolemia, will add evolocumab (Repatha) 140 mg subcutaneously every 2 weeks. Cardiology in agreement with plan.  Repatha chosen per [employer] Repatha Prior auth approved thru 11/06/22. Patient reports copay is affordable ($37/month) and can only get 30 day supply at a time per insurance requirements. If copay becomes an issue will consider HealthWell or [employer] foundation grant or [employer]  Program.  Re-check lipid panel 4-12 weeks after initiation of PCSK9i. Order placed today. Anticipate patient will complete late April or early May. Will follow-up on results.   Osteopenia - Condition stable. Not addressed this visit.: Appropriately managed Current medications: calcium + vitamin D once daily Intolerances: none Taking medications as directed: yes Side effects thought to be attributed to current medication regimen: no Last DEXA (08/02/20): Spine L1-L4 T -2.0, total R femur T -2.0, femur total mean T -1.9 Falls reported in last year: 0 Recommend 1,000 mg of elemental calcium per day from diet or supplementation. Supplemental calcium not necessary if appropriate amount obtained through diet. Recommend 800 - 2,000 IU of vitamin D supplementation per day to maintain adequate vitamin D  levels Recommend walking, low impact aerobic exercise, or strength training  Patient Goals/Self-Care Activities Patient will:  Take medications as prescribed Check blood pressure at least once daily, document, and provide at future appointments Collaborate with provider on medication access solutions Target a minimum of 150 minutes of moderate intensity exercise weekly  Follow Up Plan: Telephone follow up appointment with care management team member scheduled for: 03/05/22      Medication Assistance: None required.  Patient affirms current coverage meets needs.  Patient's preferred pharmacy is:  [facility] [phone] - [place], [place] AT [place] [address] [place] [address] Phone: [phone] Fax: [phone]  Follow Up:  Patient agrees to Care Plan and Follow-up.  Plan: Telephone follow up appointment with care management team member scheduled for:  03/05/22  Kennon Holter, PharmD, [place], CPP Clinical Pharmacist Practitioner [facility] [phone]

## 2021-11-26 ENCOUNTER — Other Ambulatory Visit: Payer: Self-pay

## 2021-11-26 ENCOUNTER — Ambulatory Visit (HOSPITAL_COMMUNITY)
Admission: RE | Admit: 2021-11-26 | Discharge: 2021-11-26 | Disposition: A | Discharge status: Home / Self Care | Payer: Medicare Other | Source: Ambulatory Visit | Attending: Cardiology | Admitting: Cardiology

## 2021-11-26 DIAGNOSIS — R011 Cardiac murmur, unspecified: Secondary | ICD-10-CM | POA: Diagnosis not present

## 2021-11-26 LAB — ECHOCARDIOGRAM COMPLETE
AR max vel: 1.19 cm2
AV Area VTI: 1.23 cm2
AV Area mean vel: 1.14 cm2
AV Mean grad: 18 mmHg
AV Peak grad: 29.8 mmHg
Ao pk vel: 2.73 m/s
Area-P 1/2: 3.6 cm2
P 1/2 time: 577 msec
S' Lateral: 2.5 cm

## 2021-11-26 NOTE — Progress Notes (Signed)
*  PRELIMINARY RESULTS* Echocardiogram 2D Echocardiogram has been performed.  Stacey Drain 11/26/2021, 12:44 PM

## 2021-11-27 ENCOUNTER — Other Ambulatory Visit: Payer: Self-pay | Admitting: *Deleted

## 2021-11-27 DIAGNOSIS — E785 Hyperlipidemia, unspecified: Secondary | ICD-10-CM | POA: Diagnosis not present

## 2021-11-27 DIAGNOSIS — I10 Essential (primary) hypertension: Secondary | ICD-10-CM

## 2021-11-27 DIAGNOSIS — I359 Nonrheumatic aortic valve disorder, unspecified: Secondary | ICD-10-CM

## 2021-12-06 ENCOUNTER — Other Ambulatory Visit: Payer: Self-pay

## 2021-12-06 ENCOUNTER — Ambulatory Visit (HOSPITAL_COMMUNITY)
Admission: RE | Admit: 2021-12-06 | Discharge: 2021-12-06 | Disposition: A | Discharge status: Home / Self Care | Payer: Medicare Other | Source: Ambulatory Visit | Attending: Cardiology | Admitting: Cardiology

## 2021-12-06 DIAGNOSIS — E785 Hyperlipidemia, unspecified: Secondary | ICD-10-CM | POA: Insufficient documentation

## 2021-12-17 ENCOUNTER — Other Ambulatory Visit: Payer: Self-pay | Admitting: Internal Medicine

## 2022-01-16 ENCOUNTER — Encounter: Payer: Self-pay | Admitting: Internal Medicine

## 2022-01-17 DIAGNOSIS — L237 Allergic contact dermatitis due to plants, except food: Secondary | ICD-10-CM | POA: Diagnosis not present

## 2022-01-17 NOTE — Telephone Encounter (Signed)
Patient has appt today urgent care at 3:30. ?

## 2022-01-17 NOTE — Telephone Encounter (Signed)
noted 

## 2022-03-04 ENCOUNTER — Ambulatory Visit: Payer: Medicare Other | Admitting: Internal Medicine

## 2022-03-05 ENCOUNTER — Telehealth: Payer: Medicare Other

## 2022-03-12 ENCOUNTER — Encounter: Payer: Self-pay | Admitting: Internal Medicine

## 2022-03-12 ENCOUNTER — Ambulatory Visit (INDEPENDENT_AMBULATORY_CARE_PROVIDER_SITE_OTHER): Payer: Medicare Other | Admitting: Internal Medicine

## 2022-03-12 VITALS — BP 112/80 | HR 83 | Resp 18 | Ht 68.0 in | Wt 166.6 lb

## 2022-03-12 DIAGNOSIS — E782 Mixed hyperlipidemia: Secondary | ICD-10-CM

## 2022-03-12 DIAGNOSIS — I350 Nonrheumatic aortic (valve) stenosis: Secondary | ICD-10-CM | POA: Diagnosis not present

## 2022-03-12 DIAGNOSIS — I10 Essential (primary) hypertension: Secondary | ICD-10-CM | POA: Diagnosis not present

## 2022-03-12 NOTE — Progress Notes (Signed)
? ?Established Patient Office Visit ? ?Subjective:  ?Patient ID: Tonya Pacheco, female    DOB: Feb 01, 1946  Age: 76 y.o. MRN: [id number] ? ?CC:  ?Chief Complaint  ?Patient presents with  ? Follow-up  ?  6 month follow up HTN   ? ? ?HPI ?Tonya Pacheco is a 76 y.o. female with past medical history of HTN, osteopenia and HLD who presents for f/u of her chronic medical conditions. ? ?She has been doing well overall. ?  ?BP is well-controlled. Takes medications regularly. Patient denies headache, dizziness, chest pain, dyspnea or palpitations. ?  ?She has a history of HLD, but had myositis with statin. She has started Repatha, and is tolerating it well. ? ?Past Medical History:  ?Diagnosis Date  ? Hypertension   ? ? ?Past Surgical History:  ?Procedure Laterality Date  ? COLONOSCOPY WITH PROPOFOL N/A 09/15/2020  ? Procedure: COLONOSCOPY WITH PROPOFOL;  Surgeon: Harvel Quale, MD;  Location: [facility];  Service: Gastroenterology;  Laterality: N/A;  915  ? ? ?Family History  ?Problem Relation Age of Onset  ? Arthritis Sister 35  ?     Rheumatoid Arthritis  ? Cancer Maternal Grandmother   ? Heart disease Sister   ? Lupus Sister   ? Kidney disease Brother 54  ?     Kidney transplant  ? ? ?Social History  ? ?Socioeconomic History  ? Marital status: Widowed  ?  Spouse name: Not on file  ? Number of children: Not on file  ? Years of education: Not on file  ? Highest education level: Not on file  ?Occupational History  ? Not on file  ?Tobacco Use  ? Smoking status: Never  ? Smokeless tobacco: Never  ?Vaping Use  ? Vaping Use: Never used  ?Substance and Sexual Activity  ? Alcohol use: No  ? Drug use: No  ? Sexual activity: Not Currently  ?Other Topics Concern  ? Not on file  ?Social History Narrative  ? Not on file  ? ?Social Determinants of Health  ? ?Financial Resource Strain: Low Risk   ? Difficulty of Paying Living Expenses: Not hard at all  ?Food Insecurity: No Food Insecurity  ? Worried About Charity fundraiser in  the Last Year: Never true  ? Ran Out of Food in the Last Year: Never true  ?Transportation Needs: No Transportation Needs  ? Lack of Transportation (Medical): No  ? Lack of Transportation (Non-Medical): No  ?Physical Activity: Sufficiently Active  ? Days of Exercise per Week: 6 days  ? Minutes of Exercise per Session: 30 min  ?Stress: No Stress Concern Present  ? Feeling of Stress : Not at all  ?Social Connections: Moderately Isolated  ? Frequency of Communication with Friends and Family: More than three times a week  ? Frequency of Social Gatherings with Friends and Family: Three times a week  ? Attends Religious Services: More than 4 times per year  ? Active Member of Clubs or Organizations: No  ? Attends Archivist Meetings: Never  ? Marital Status: Widowed  ?Intimate Partner Violence: Not At Risk  ? Fear of Current or Ex-Partner: No  ? Emotionally Abused: No  ? Physically Abused: No  ? Sexually Abused: No  ? ? ?Outpatient Medications Prior to Visit  ?Medication Sig Dispense Refill  ? amLODipine (NORVASC) 5 MG tablet TAKE 1 TABLET(5 MG) BY MOUTH DAILY 90 tablet 0  ? aspirin 81 MG tablet Take 81 mg by mouth daily.    ?  calcium carbonate (OS-CAL) 600 MG TABS tablet Take 600 mg by mouth every evening.     ? Cholecalciferol (D3 2000 PO) Take 2,000 Units by mouth daily.    ? Evolocumab (REPATHA SURECLICK) 395 MG/ML SOAJ Inject 140 mg into the skin every 14 (fourteen) days. 2 mL 11  ? Multiple Vitamins-Minerals (CENTRUM ADULTS PO) Take 1 tablet by mouth every evening.     ? ?No facility-administered medications prior to visit.  ? ? ?Allergies  ?Allergen Reactions  ? Penicillins Swelling  ?  Swelling mouth/ tongue ?  ? Zetia [Ezetimibe] Swelling  ?  Eyes puffy, tongue swelling  ? Statins Other (See Comments)  ?  Muscle/joint aches  ? ? ?ROS ?Review of Systems  ?Constitutional:  Negative for chills and fever.  ?HENT:  Negative for congestion, sinus pressure, sinus pain and sore throat.   ?Eyes:  Negative for  pain and discharge.  ?Respiratory:  Negative for cough and shortness of breath.   ?Cardiovascular:  Negative for chest pain and palpitations.  ?Gastrointestinal:  Negative for abdominal pain, constipation, diarrhea, nausea and vomiting.  ?Endocrine: Negative for polydipsia and polyuria.  ?Genitourinary:  Negative for dysuria and hematuria.  ?Musculoskeletal:  Negative for neck pain and neck stiffness.  ?Skin:  Negative for rash.  ?Neurological:  Negative for dizziness and weakness.  ?Psychiatric/Behavioral:  Negative for agitation and behavioral problems.   ? ?  ?Objective:  ?  ?Physical Exam ?Vitals reviewed.  ?Constitutional:   ?   General: She is not in acute distress. ?   Appearance: She is not diaphoretic.  ?HENT:  ?   Head: Normocephalic and atraumatic.  ?   Nose: Nose normal.  ?   Mouth/Throat:  ?   Mouth: Mucous membranes are moist.  ?Eyes:  ?   General: No scleral icterus. ?   Extraocular Movements: Extraocular movements intact.  ?Cardiovascular:  ?   Rate and Rhythm: Normal rate and regular rhythm.  ?   Pulses: Normal pulses.  ?   Heart sounds: Murmur (Systolic, most profound at left upper sternal border) heard.  ?Pulmonary:  ?   Breath sounds: Normal breath sounds. No wheezing or rales.  ?Abdominal:  ?   Palpations: Abdomen is soft.  ?   Tenderness: There is no abdominal tenderness.  ?Musculoskeletal:  ?   Cervical back: Neck supple. No tenderness.  ?   Right lower leg: No edema.  ?   Left lower leg: No edema.  ?Skin: ?   General: Skin is warm.  ?   Findings: No rash.  ?Neurological:  ?   General: No focal deficit present.  ?   Mental Status: She is alert and oriented to person, place, and time.  ?   Sensory: No sensory deficit.  ?   Motor: No weakness.  ?Psychiatric:     ?   Mood and Affect: Mood normal.     ?   Behavior: Behavior normal.  ? ? ?BP 112/80 (BP Location: Left Arm, Patient Position: Sitting, Cuff Size: Normal)   Pulse 83   Resp 18   Ht $R'5\' 8"'fh$  (1.727 m)   Wt 166 lb 9.6 oz (75.6 kg)   SpO2  96%   BMI 25.33 kg/m?  ?Wt Readings from Last 3 Encounters:  ?03/12/22 166 lb 9.6 oz (75.6 kg)  ?11/02/21 175 lb (79.4 kg)  ?09/03/21 177 lb 0.6 oz (80.3 kg)  ? ? ?Lab Results  ?Component Value Date  ? TSH 3.050 09/03/2021  ? ?Lab Results  ?  Component Value Date  ? WBC 5.1 09/03/2021  ? HGB 13.4 09/03/2021  ? HCT 40.4 09/03/2021  ? MCV 92 09/03/2021  ? PLT 230 09/03/2021  ? ?Lab Results  ?Component Value Date  ? NA 143 09/03/2021  ? K 4.7 09/03/2021  ? CO2 24 09/03/2021  ? GLUCOSE 96 09/03/2021  ? BUN 14 09/03/2021  ? CREATININE 0.96 09/03/2021  ? BILITOT 0.4 09/03/2021  ? ALKPHOS 90 09/03/2021  ? AST 19 09/03/2021  ? ALT 13 09/03/2021  ? PROT 7.2 09/03/2021  ? ALBUMIN 4.5 09/03/2021  ? CALCIUM 9.6 09/03/2021  ? ANIONGAP 6 07/01/2018  ? EGFR 62 09/03/2021  ? ?Lab Results  ?Component Value Date  ? CHOL 291 (H) 09/03/2021  ? ?Lab Results  ?Component Value Date  ? HDL 74 09/03/2021  ? ?Lab Results  ?Component Value Date  ? LDLCALC 202 (H) 09/03/2021  ? ?Lab Results  ?Component Value Date  ? TRIG 92 09/03/2021  ? ?Lab Results  ?Component Value Date  ? CHOLHDL 3.9 09/03/2021  ? ?Lab Results  ?Component Value Date  ? HGBA1C 6.0 (H) 09/03/2021  ? ? ?  ?Assessment & Plan:  ? ?Problem List Items Addressed This Visit   ? ?  ? Cardiovascular and Mediastinum  ? Essential hypertension, benign - Primary  ?  BP Readings from Last 1 Encounters:  ?03/12/22 112/80  ?Well-controlled with Amlodipine 5 mg QD ?Counseled for compliance with the medications ?Advised DASH diet and moderate exercise/walking, at least 150 mins/week ?  ?  ? Relevant Orders  ? Basic Metabolic Panel (BMET)  ? Nonrheumatic aortic valve stenosis  ?  Echo reviewed ?Currently asymptomatic ?Repeat Echo in 1 year ? ?  ?  ?  ? Other  ? Hyperlipidemia  ?  Last lipid profile reviewed ?Did not tolerate statin and Zetia in the past ?On Repatha now ?Continue to follow low carb and low cholesterol diet ? ?  ?  ? Relevant Orders  ? Lipid Profile  ? ? ?No orders of the defined  types were placed in this encounter. ? ? ?Follow-up: Return in about 6 months (around 09/12/2022) for Annual physical (after 11/11).  ? ? ?Lindell Spar, MD ?

## 2022-03-12 NOTE — Patient Instructions (Addendum)
Please continue taking medications as prescribed. ? ?Please continue to follow low salt diet and perform moderate exercise/walking as tolerated. ? ?Please consider getting Shingrix and Tdap vaccine at your local pharmacy. ?

## 2022-03-12 NOTE — Assessment & Plan Note (Signed)
BP Readings from Last 1 Encounters:  ?03/12/22 112/80  ? ?Well-controlled with Amlodipine 5 mg QD ?Counseled for compliance with the medications ?Advised DASH diet and moderate exercise/walking, at least 150 mins/week ?

## 2022-03-12 NOTE — Assessment & Plan Note (Signed)
Echo reviewed ?Currently asymptomatic ?Repeat Echo in 1 year ?

## 2022-03-12 NOTE — Assessment & Plan Note (Signed)
Last lipid profile reviewed Did not tolerate statin and Zetia in the past On Repatha now Continue to follow low carb and low cholesterol diet 

## 2022-03-15 ENCOUNTER — Other Ambulatory Visit: Payer: Self-pay | Admitting: Internal Medicine

## 2022-03-20 DIAGNOSIS — E782 Mixed hyperlipidemia: Secondary | ICD-10-CM | POA: Diagnosis not present

## 2022-03-20 DIAGNOSIS — I10 Essential (primary) hypertension: Secondary | ICD-10-CM | POA: Diagnosis not present

## 2022-03-21 LAB — BASIC METABOLIC PANEL
BUN/Creatinine Ratio: 17 (ref 12–28)
BUN: 17 mg/dL (ref 8–27)
CO2: 24 mmol/L (ref 20–29)
Calcium: 9.3 mg/dL (ref 8.7–10.3)
Chloride: 103 mmol/L (ref 96–106)
Creatinine, Ser: 1 mg/dL (ref 0.57–1.00)
Glucose: 90 mg/dL (ref 70–99)
Potassium: 4.6 mmol/L (ref 3.5–5.2)
Sodium: 141 mmol/L (ref 134–144)
eGFR: 59 mL/min/{1.73_m2} — ABNORMAL LOW (ref >59)

## 2022-03-21 LAB — LIPID PANEL
Chol/HDL Ratio: 2.3 ratio (ref 0.0–4.4)
Cholesterol, Total: 160 mg/dL (ref 100–199)
HDL: 69 mg/dL (ref >39)
LDL Chol Calc (NIH): 80 mg/dL (ref 0–99)
Triglycerides: 55 mg/dL (ref 0–149)
VLDL Cholesterol Cal: 11 mg/dL (ref 5–40)

## 2022-04-08 ENCOUNTER — Encounter: Payer: Self-pay | Admitting: Internal Medicine

## 2022-04-09 ENCOUNTER — Ambulatory Visit (INDEPENDENT_AMBULATORY_CARE_PROVIDER_SITE_OTHER): Payer: Medicare Other | Admitting: Family Medicine

## 2022-04-09 ENCOUNTER — Encounter: Payer: Self-pay | Admitting: Family Medicine

## 2022-04-09 VITALS — BP 122/80 | HR 72 | Ht 68.0 in | Wt 164.2 lb

## 2022-04-09 DIAGNOSIS — H6123 Impacted cerumen, bilateral: Secondary | ICD-10-CM | POA: Diagnosis not present

## 2022-04-09 DIAGNOSIS — H9223 Otorrhagia, bilateral: Secondary | ICD-10-CM

## 2022-04-09 NOTE — Assessment & Plan Note (Signed)
Impacted cerumen in both ears Ears irrigation was performed with relief of symptoms, and hearing restored

## 2022-04-09 NOTE — Patient Instructions (Signed)
I appreciate the opportunity to provide care to you today!   Please F/U with Dr. Allena Katz as scheduled   Please continue to a heart-healthy diet and increase your physical activities. Try to exercise for at least three times a week.      It was a pleasure to see you and I look forward to continuing to work together on your health and well-being. Please do not hesitate to call the office if you need care or have questions about your care.   Have a wonderful day and week. With Gratitude, Gilmore Laroche MSN, FNP-BC

## 2022-04-09 NOTE — Progress Notes (Signed)
 Established Patient Office Visit  Subjective:  Patient ID: Tonya Pacheco, female    DOB: 09/12/1946  Age: 76 y.o. MRN: [id number]  CC:  Chief Complaint  Patient presents with   Cerumen Impaction    Pt c/o of ear fullness.     HPI Tonya Pacheco is a 76 y.o. female with past medical history of essential hypertension presents with c/o of right ear fullness with decreased hearing in the affected ears for a week and a half. No other symptoms were reported.    Past Medical History:  Diagnosis Date   Hypertension     Past Surgical History:  Procedure Laterality Date   COLONOSCOPY WITH PROPOFOL N/A 09/15/2020   Procedure: COLONOSCOPY WITH PROPOFOL;  Surgeon: Castaneda Mayorga, Daniel, MD;  Location: [facility];  Service: Gastroenterology;  Laterality: N/A;  915    Family History  Problem Relation Age of Onset   Arthritis Sister 70       Rheumatoid Arthritis   Cancer Maternal Grandmother    Heart disease Sister    Lupus Sister    Kidney disease Brother 61       Kidney transplant    Social History   Socioeconomic History   Marital status: Widowed    Spouse name: Not on file   Number of children: Not on file   Years of education: Not on file   Highest education level: Not on file  Occupational History   Not on file  Tobacco Use   Smoking status: Never   Smokeless tobacco: Never  Vaping Use   Vaping Use: Never used  Substance and Sexual Activity   Alcohol use: No   Drug use: No   Sexual activity: Not Currently  Other Topics Concern   Not on file  Social History Narrative   Not on file   Social Determinants of Health   Financial Resource Strain: Low Risk  (06/26/2021)   Overall Financial Resource Strain (CARDIA)    Difficulty of Paying Living Expenses: Not hard at all  Food Insecurity: No Food Insecurity (06/26/2021)   Hunger Vital Sign    Worried About Running Out of Food in the Last Year: Never true    Ran Out of Food in the Last Year: Never true   Transportation Needs: No Transportation Needs (06/26/2021)   PRAPARE - Transportation    Lack of Transportation (Medical): No    Lack of Transportation (Non-Medical): No  Physical Activity: Sufficiently Active (06/26/2021)   Exercise Vital Sign    Days of Exercise per Week: 6 days    Minutes of Exercise per Session: 30 min  Stress: No Stress Concern Present (06/26/2021)   [employer] - Occupational Stress Questionnaire    Feeling of Stress : Not at all  Social Connections: Moderately Isolated (06/26/2021)   Social Connection and Isolation Panel [NHANES]    Frequency of Communication with Friends and Family: More than three times a week    Frequency of Social Gatherings with Friends and Family: Three times a week    Attends Religious Services: More than 4 times per year    Active Member of Clubs or Organizations: No    Attends Club or Organization Meetings: Never    Marital Status: Widowed  Intimate Partner Violence: Not At Risk (06/26/2021)   Humiliation, Afraid, Rape, and Kick questionnaire    Fear of Current or Ex-Partner: No    Emotionally Abused: No    Physically Abused: No    Sexually Abused:   No    Outpatient Medications Prior to Visit  Medication Sig Dispense Refill   amLODipine (NORVASC) 5 MG tablet TAKE 1 TABLET(5 MG) BY MOUTH DAILY 90 tablet 0   aspirin 81 MG tablet Take 81 mg by mouth daily.     calcium carbonate (OS-CAL) 600 MG TABS tablet Take 600 mg by mouth every evening.      Cholecalciferol (D3 2000 PO) Take 2,000 Units by mouth daily.     Evolocumab (REPATHA SURECLICK) 140 MG/ML SOAJ Inject 140 mg into the skin every 14 (fourteen) days. 2 mL 11   Multiple Vitamins-Minerals (CENTRUM ADULTS PO) Take 1 tablet by mouth every evening.      No facility-administered medications prior to visit.    Allergies  Allergen Reactions   Penicillins Swelling    Swelling mouth/ tongue    Zetia [Ezetimibe] Swelling    Eyes puffy, tongue swelling    Statins Other (See Comments)    Muscle/joint aches    ROS Review of Systems  Constitutional:  Positive for fatigue. Negative for chills and fever.  HENT:  Negative for ear discharge, ear pain (right ear fullness), hearing loss (decrease hearing in the right ear) and tinnitus.   Respiratory:  Negative for chest tightness and shortness of breath.   Psychiatric/Behavioral:  Negative for self-injury and suicidal ideas.       Objective:    Physical Exam HENT:     Right Ear: Decreased hearing noted. There is impacted cerumen.     Left Ear: Decreased hearing noted. There is impacted cerumen.  Cardiovascular:     Rate and Rhythm: Normal rate and regular rhythm.     Pulses: Normal pulses.     Heart sounds: Normal heart sounds.  Pulmonary:     Effort: Pulmonary effort is normal.     Breath sounds: Normal breath sounds.  Neurological:     Mental Status: She is alert.     BP 122/80   Pulse 72   Ht 5' 8" (1.727 m)   Wt 164 lb 3.2 oz (74.5 kg)   SpO2 98%   BMI 24.97 kg/m  Wt Readings from Last 3 Encounters:  04/09/22 164 lb 3.2 oz (74.5 kg)  03/12/22 166 lb 9.6 oz (75.6 kg)  11/02/21 175 lb (79.4 kg)    Lab Results  Component Value Date   TSH 3.050 09/03/2021   Lab Results  Component Value Date   WBC 5.1 09/03/2021   HGB 13.4 09/03/2021   HCT 40.4 09/03/2021   MCV 92 09/03/2021   PLT 230 09/03/2021   Lab Results  Component Value Date   NA 141 03/20/2022   K 4.6 03/20/2022   CO2 24 03/20/2022   GLUCOSE 90 03/20/2022   BUN 17 03/20/2022   CREATININE 1.00 03/20/2022   BILITOT 0.4 09/03/2021   ALKPHOS 90 09/03/2021   AST 19 09/03/2021   ALT 13 09/03/2021   PROT 7.2 09/03/2021   ALBUMIN 4.5 09/03/2021   CALCIUM 9.3 03/20/2022   ANIONGAP 6 07/01/2018   EGFR 59 (L) 03/20/2022   Lab Results  Component Value Date   CHOL 160 03/20/2022   Lab Results  Component Value Date   HDL 69 03/20/2022   Lab Results  Component Value Date   LDLCALC 80 03/20/2022   Lab  Results  Component Value Date   TRIG 55 03/20/2022   Lab Results  Component Value Date   CHOLHDL 2.3 03/20/2022   Lab Results  Component Value Date   HGBA1C   6.0 (H) 09/03/2021      Assessment & Plan:   Problem List Items Addressed This Visit       Nervous and Auditory   Bilateral impacted cerumen    Impacted cerumen in both ears Ears irrigation was performed with relief of symptoms, and hearing restored       No orders of the defined types were placed in this encounter.   Follow-up: Return if symptoms worsen or fail to improve.    Alvira Monday, FNP

## 2022-04-15 NOTE — Progress Notes (Unsigned)
Cardiology Office Note:    Date:  04/15/2022   ID:  Tonya Pacheco, DOB Aug 14, 1946, MRN [id number]  PCP:  Anabel Halon, MD  Cardiologist:  None  Electrophysiologist:  None   Referring MD: Anabel Halon, MD   No chief complaint on file.   History of Present Illness:    Tonya Pacheco is a 76 y.o. female with a hx of hypertension, hyperlipidemia who presents for follow-up.  She was referred by Dr. Allena Katz for evaluation of heart murmur, initially seen on 11/02/2021.  She reports that she is doing well.  Denies any chest pain, dyspnea, lightheadedness, syncope, lower extremity edema, or palpitations.  She does yard work for exercise.  Frequently will walk up to 15,000 steps per day.  She reports intolerance to multiple statins due to muscle aches.  No smoking history.  No history of heart disease in immediate family.  Echocardiogram 11/26/2021 showed normal biventricular function, grade 1 diastolic dysfunction, moderate left atrial enlargement, mild MR, moderate AS.  Calcium score 12/06/2021 with 210 (83rd percentile).  Since last clinic visit,   Past Medical History:  Diagnosis Date   Hypertension     Past Surgical History:  Procedure Laterality Date   COLONOSCOPY WITH PROPOFOL N/A 09/15/2020   Procedure: COLONOSCOPY WITH PROPOFOL;  Surgeon: Dolores Frame, MD;  Location: [facility];  Service: Gastroenterology;  Laterality: N/A;  915    Current Medications: No outpatient medications have been marked as taking for the 04/19/22 encounter (Appointment) with Little Ishikawa, MD.     Allergies:   Penicillins, Zetia [ezetimibe], and Statins   Social History   Socioeconomic History   Marital status: Widowed    Spouse name: Not on file   Number of children: Not on file   Years of education: Not on file   Highest education level: Not on file  Occupational History   Not on file  Tobacco Use   Smoking status: Never   Smokeless tobacco: Never  Vaping Use   Vaping  Use: Never used  Substance and Sexual Activity   Alcohol use: No   Drug use: No   Sexual activity: Not Currently  Other Topics Concern   Not on file  Social History Narrative   Not on file   Social Determinants of Health   Financial Resource Strain: Low Risk  (06/26/2021)   Overall Financial Resource Strain (CARDIA)    Difficulty of Paying Living Expenses: Not hard at all  Food Insecurity: No Food Insecurity (06/26/2021)   Hunger Vital Sign    Worried About Running Out of Food in the Last Year: Never true    Ran Out of Food in the Last Year: Never true  Transportation Needs: No Transportation Needs (06/26/2021)   PRAPARE - Administrator, Civil Service (Medical): No    Lack of Transportation (Non-Medical): No  Physical Activity: Sufficiently Active (06/26/2021)   Exercise Vital Sign    Days of Exercise per Week: 6 days    Minutes of Exercise per Session: 30 min  Stress: No Stress Concern Present (06/26/2021)   [employer] of Occupational Health - Occupational Stress Questionnaire    Feeling of Stress : Not at all  Social Connections: Moderately Isolated (06/26/2021)   Social Connection and Isolation Panel [NHANES]    Frequency of Communication with Friends and Family: More than three times a week    Frequency of Social Gatherings with Friends and Family: Three times a week    Attends Religious  Services: More than 4 times per year    Active Member of Clubs or Organizations: No    Attends Banker Meetings: Never    Marital Status: Widowed     Family History: The patient's family history includes Arthritis (age of onset: 32) in her sister; Cancer in her maternal grandmother; Heart disease in her sister; Kidney disease (age of onset: 56) in her brother; Lupus in her sister.  ROS:   Please see the history of present illness.     All other systems reviewed and are negative.  EKGs/Labs/Other Studies Reviewed:    The following studies were reviewed  today:   EKG:  EKG is  ordered today.  The ekg ordered today demonstrates normal sinus rhythm, rate 73, no ST abnormalities  Recent Labs: 09/03/2021: ALT 13; Hemoglobin 13.4; Platelets 230; TSH 3.050 03/20/2022: BUN 17; Creatinine, Ser 1.00; Potassium 4.6; Sodium 141  Recent Lipid Panel    Component Value Date/Time   CHOL 160 03/20/2022 0809   TRIG 55 03/20/2022 0809   HDL 69 03/20/2022 0809   CHOLHDL 2.3 03/20/2022 0809   CHOLHDL 4.1 03/29/2020 0832   VLDL 18 07/30/2016 1007   LDLCALC 80 03/20/2022 0809   LDLCALC 201 (H) 03/29/2020 5366    Physical Exam:    VS:  There were no vitals taken for this visit.    Wt Readings from Last 3 Encounters:  04/09/22 164 lb 3.2 oz (74.5 kg)  03/12/22 166 lb 9.6 oz (75.6 kg)  11/02/21 175 lb (79.4 kg)     GEN:  Well nourished, well developed in no acute distress HEENT: Normal NECK: No JVD; No carotid bruits LYMPHATICS: No lymphadenopathy CARDIAC: RRR, 2 out of 6 systolic murmur RESPIRATORY:  Clear to auscultation without rales, wheezing or rhonchi  ABDOMEN: Soft, non-tender, non-distended MUSCULOSKELETAL:  No edema; No deformity  SKIN: Warm and dry NEUROLOGIC:  Alert and oriented x 3 PSYCHIATRIC:  Normal affect   ASSESSMENT:    No diagnosis found.  PLAN:    Aortic stenosis: Moderate on echocardiogram 11/26/2021.  Repeat echo 10/2022 to follow  Hypertension: On amlodipine 5 mg daily.  Appears controlled.   Hyperlipidemia: Did not tolerate statin or Zetia.  LDL 202 on 09/03/2021.  Calcium score 12/06/2021 with 210 (83rd percentile).  Likely FH.  Started on Repatha with significant improvement, LDL 80 on 03/20/2022 -Continue Repatha  RTC in ***  Medication Adjustments/Labs and Tests Ordered: Current medicines are reviewed at length with the patient today.  Concerns regarding medicines are outlined above.  No orders of the defined types were placed in this encounter.  No orders of the defined types were placed in this  encounter.   There are no Patient Instructions on file for this visit.   Signed, Little Ishikawa, MD  04/15/2022 2:38 PM    [facility]

## 2022-04-19 ENCOUNTER — Encounter: Payer: Self-pay | Admitting: Cardiology

## 2022-04-19 ENCOUNTER — Ambulatory Visit: Payer: Medicare Other | Admitting: Cardiology

## 2022-04-19 VITALS — BP 118/76 | HR 70 | Ht 67.5 in | Wt 164.0 lb

## 2022-04-19 DIAGNOSIS — I350 Nonrheumatic aortic (valve) stenosis: Secondary | ICD-10-CM

## 2022-04-19 DIAGNOSIS — I10 Essential (primary) hypertension: Secondary | ICD-10-CM

## 2022-04-19 DIAGNOSIS — E785 Hyperlipidemia, unspecified: Secondary | ICD-10-CM

## 2022-04-23 ENCOUNTER — Ambulatory Visit: Payer: Medicare Other | Admitting: *Deleted

## 2022-04-23 DIAGNOSIS — E782 Mixed hyperlipidemia: Secondary | ICD-10-CM

## 2022-04-23 DIAGNOSIS — I10 Essential (primary) hypertension: Secondary | ICD-10-CM

## 2022-06-14 ENCOUNTER — Other Ambulatory Visit: Payer: Self-pay | Admitting: Internal Medicine

## 2022-07-10 ENCOUNTER — Telehealth: Payer: Self-pay

## 2022-07-10 NOTE — Telephone Encounter (Signed)
Spoke with patient she will pay the 126.00 she will apply for assitance and keep [place] posted

## 2022-07-10 NOTE — Telephone Encounter (Signed)
Patient called said the medicine Evolocumab (REPATHA SURECLICK) 140 MG/ML SOAJ  That the cost has gone up $ 126.00, saying pharmacist Domenic Moras sent a letter to [facility] on central plus plan and was approved and monthly payment $ 37.00  a month, can't afford now for $126.00 month.   Pharmacy: [facility] [address] [place]

## 2022-07-11 ENCOUNTER — Ambulatory Visit (INDEPENDENT_AMBULATORY_CARE_PROVIDER_SITE_OTHER): Payer: Medicare Other

## 2022-07-11 DIAGNOSIS — Z0000 Encounter for general adult medical examination without abnormal findings: Secondary | ICD-10-CM | POA: Diagnosis not present

## 2022-07-11 NOTE — Progress Notes (Signed)
Subjective:   Tonya Pacheco is a 77 y.o. female who presents for Medicare Annual (Subsequent) preventive examination. I connected with  Tonya Pacheco on 07/11/22 by a audio enabled telemedicine application and verified that I am speaking with the correct person using two identifiers.  Patient Location: Home  Provider Location: Office/Clinic  I discussed the limitations of evaluation and management by telemedicine. The patient expressed understanding and agreed to proceed.   Review of Systems     [name] , Thank you for taking time to come for your Medicare Wellness Visit. I appreciate your ongoing commitment to your health goals. Please review the following plan we discussed and let me know if I can assist you in the future.   These are the goals we discussed:  Goals      Medication Management     Patient Goals/Self-Care Activities Patient will:  Take medications as prescribed Check blood pressure at least once daily, document, and provide at future appointments Collaborate with provider on medication access solutions Target a minimum of 150 minutes of moderate intensity exercise weekly         This is a list of the screening recommended for you and due dates:  Health Maintenance  Topic Date Due   Zoster (Shingles) Vaccine (1 of 2) Never done   Tetanus Vaccine  10/28/2020   COVID-19 Vaccine (4 - Moderna series) 03/07/2022   Flu Shot  05/28/2022   Mammogram  08/02/2022   Pneumonia Vaccine  Completed   DEXA scan (bone density measurement)  Completed   Hepatitis C Screening: USPSTF Recommendation to screen - Ages 69-79 yo.  Completed   HPV Vaccine  Aged Out   Colon Cancer Screening  Discontinued          Objective:    There were no vitals filed for this visit. There is no height or weight on file to calculate BMI.     06/26/2021    3:13 PM 09/15/2020    8:15 AM 03/29/2020    8:10 AM 01/05/2019    9:55 AM 07/09/2018   12:07 AM 07/01/2018    1:30 AM  Advanced  Directives  Does Patient Have a Medical Advance Directive? Yes Yes No No No No  Type of Estate agent of [place];Living will Living will      Would patient like information on creating a medical advance directive?   Yes (MAU/Ambulatory/Procedural Areas - Information given) Yes (MAU/Ambulatory/Procedural Areas - Information given)      Current Medications (verified) Outpatient Encounter Medications as of 07/11/2022  Medication Sig   amLODipine (NORVASC) 5 MG tablet TAKE 1 TABLET(5 MG) BY MOUTH DAILY   aspirin 81 MG tablet Take 81 mg by mouth daily.   calcium carbonate (OS-CAL) 600 MG TABS tablet Take 600 mg by mouth every evening.    Cholecalciferol (D3 2000 PO) Take 2,000 Units by mouth daily.   Evolocumab (REPATHA SURECLICK) 140 MG/ML SOAJ Inject 140 mg into the skin every 14 (fourteen) days.   Multiple Vitamins-Minerals (CENTRUM ADULTS PO) Take 1 tablet by mouth every evening.    No facility-administered encounter medications on file as of 07/11/2022.    Allergies (verified) Penicillins, Zetia [ezetimibe], and Statins   History: Past Medical History:  Diagnosis Date   Hypertension    Past Surgical History:  Procedure Laterality Date   COLONOSCOPY WITH PROPOFOL N/A 09/15/2020   Procedure: COLONOSCOPY WITH PROPOFOL;  Surgeon: Dolores Frame, MD;  Location: [facility];  Service: Gastroenterology;  Laterality: N/A;  915   Family History  Problem Relation Age of Onset   Arthritis Sister 52       Rheumatoid Arthritis   Cancer Maternal Grandmother    Heart disease Sister    Lupus Sister    Kidney disease Brother 57       Kidney transplant   Social History   Socioeconomic History   Marital status: Widowed    Spouse name: Not on file   Number of children: Not on file   Years of education: Not on file   Highest education level: Not on file  Occupational History   Not on file  Tobacco Use   Smoking status: Never   Smokeless tobacco: Never   Vaping Use   Vaping Use: Never used  Substance and Sexual Activity   Alcohol use: No   Drug use: No   Sexual activity: Not Currently  Other Topics Concern   Not on file  Social History Narrative   Not on file   Social Determinants of Health   Financial Resource Strain: Low Risk  (06/26/2021)   Overall Financial Resource Strain (CARDIA)    Difficulty of Paying Living Expenses: Not hard at all  Food Insecurity: No Food Insecurity (06/26/2021)   Hunger Vital Sign    Worried About Running Out of Food in the Last Year: Never true    Ran Out of Food in the Last Year: Never true  Transportation Needs: No Transportation Needs (06/26/2021)   PRAPARE - Administrator, Civil Service (Medical): No    Lack of Transportation (Non-Medical): No  Physical Activity: Sufficiently Active (06/26/2021)   Exercise Vital Sign    Days of Exercise per Week: 6 days    Minutes of Exercise per Session: 30 min  Stress: No Stress Concern Present (06/26/2021)   [employer] of Occupational Health - Occupational Stress Questionnaire    Feeling of Stress : Not at all  Social Connections: Moderately Isolated (06/26/2021)   Social Connection and Isolation Panel [NHANES]    Frequency of Communication with Friends and Family: More than three times a week    Frequency of Social Gatherings with Friends and Family: Three times a week    Attends Religious Services: More than 4 times per year    Active Member of Clubs or Organizations: No    Attends Banker Meetings: Never    Marital Status: Widowed    Tobacco Counseling Counseling given: Not Answered   Clinical Intake:                 Diabetic? No         Activities of Daily Living     No data to display           Patient Care Team: Anabel Halon, MD as PCP - General (Internal Medicine)  Indicate any recent Medical Services you may have received from other than [facility] providers in the past year (date may be  approximate).     Assessment:   This is a routine wellness examination for [place].  Hearing/Vision screen No results found.  Dietary issues and exercise activities discussed:     Goals Addressed   None   Depression Screen    04/09/2022    1:35 PM 03/12/2022    3:39 PM 09/03/2021    9:13 AM 06/26/2021    3:09 PM 02/26/2021    9:15 AM 03/29/2020    8:09 AM 01/05/2019    9:54 AM  PHQ 2/9 Scores  PHQ - 2 Score 0 0 0 0 0 0 0  PHQ- 9 Score      0     Fall Risk    04/09/2022    1:35 PM 03/12/2022    3:39 PM 09/03/2021    9:13 AM 06/26/2021    3:13 PM 02/26/2021    9:15 AM  Fall Risk   Falls in the past year? 0 0 0 0 0  Number falls in past yr: 0 0 0 0 0  Injury with Fall? 0 0 0 0 0  Risk for fall due to : No Fall Risks No Fall Risks No Fall Risks No Fall Risks No Fall Risks  Follow up Falls evaluation completed Falls evaluation completed Falls evaluation completed Falls evaluation completed Falls evaluation completed    FALL RISK PREVENTION PERTAINING TO THE HOME:  Any stairs in or around the home? No  If so, are there any without handrails?  N/A Home free of loose throw rugs in walkways, pet beds, electrical cords, etc? Yes  Adequate lighting in your home to reduce risk of falls? Yes   ASSISTIVE DEVICES UTILIZED TO PREVENT FALLS:  Life alert? No  Use of a cane, walker or w/c? No  Grab bars in the bathroom? No  Shower chair or bench in shower? Yes  Elevated toilet seat or a handicapped toilet? No     Cognitive Function:        06/26/2021    3:14 PM  6CIT Screen  What Year? 0 points  What month? 0 points  What time? 0 points  Count back from 20 0 points  Months in reverse 2 points  Repeat phrase 0 points  Total Score 2 points    Immunizations Immunization History  Administered Date(s) Administered   Fluad Quad(high Dose 65+) 09/03/2021   Moderna Covid-19 Vaccine Bivalent Booster 20yrs & up 11/07/2021   Moderna SARS-COV2 Booster Vaccination 10/02/2020    Moderna Sars-Covid-2 Vaccination 01/06/2020, 02/05/2020   Pneumococcal Conjugate-13 03/06/2015   Pneumococcal Polysaccharide-23 07/30/2016    TDAP status: Due, Education has been provided regarding the importance of this vaccine. Advised may receive this vaccine at local pharmacy or Health Dept. Aware to provide a copy of the vaccination record if obtained from local pharmacy or Health Dept. Verbalized acceptance and understanding.  Flu Vaccine status: Due, Education has been provided regarding the importance of this vaccine. Advised may receive this vaccine at local pharmacy or Health Dept. Aware to provide a copy of the vaccination record if obtained from local pharmacy or Health Dept. Verbalized acceptance and understanding.  Pneumococcal vaccine status: Up to date  Covid-19 vaccine status: Completed vaccines  Qualifies for Shingles Vaccine? Yes   Zostavax completed No   Shingrix Completed?: No.    Education has been provided regarding the importance of this vaccine. Patient has been advised to call insurance company to determine out of pocket expense if they have not yet received this vaccine. Advised may also receive vaccine at local pharmacy or Health Dept. Verbalized acceptance and understanding.  Screening Tests Health Maintenance  Topic Date Due   Zoster Vaccines- Shingrix (1 of 2) Never done   TETANUS/TDAP  10/28/2020   COVID-19 Vaccine (4 - Moderna series) 03/07/2022   INFLUENZA VACCINE  05/28/2022   MAMMOGRAM  08/02/2022   Pneumonia Vaccine 81+ Years old  Completed   DEXA SCAN  Completed   Hepatitis C Screening  Completed   HPV VACCINES  Aged Out  COLONOSCOPY (Pts 45-34yrs Insurance coverage will need to be confirmed)  Discontinued    Health Maintenance  Health Maintenance Due  Topic Date Due   Zoster Vaccines- Shingrix (1 of 2) Never done   TETANUS/TDAP  10/28/2020   COVID-19 Vaccine (4 - Moderna series) 03/07/2022   INFLUENZA VACCINE  05/28/2022    Colorectal  cancer screening: No longer required.   Mammogram status: Completed 08/02/2020. Repeat every year  Bone Density status: Completed 08/02/2020. Results reflect: Bone density results: OSTEOPENIA. Repeat every 3 years.  Lung Cancer Screening: (Low Dose CT Chest recommended if Age 52-80 years, 30 pack-year currently smoking OR have quit w/in 15years.) does not qualify.     Additional Screening:  Hepatitis C Screening: does qualify; Completed 12/29/2017  Vision Screening: Recommended annual ophthalmology exams for early detection of glaucoma and other disorders of the eye. Is the patient up to date with their annual eye exam?  No   appt coming up Who is the provider or what is the name of the office in which the patient attends annual eye exams? [facility]   Dental Screening: Recommended annual dental exams for proper oral hygiene  Community Resource Referral / Chronic Care Management: CRR required this visit?  No   CCM required this visit?  No      Plan:     I have personally reviewed and noted the following in the patient's chart:   Medical and social history Use of alcohol, tobacco or illicit drugs  Current medications and supplements including opioid prescriptions. Patient is not currently taking opioid prescriptions. Functional ability and status Nutritional status Physical activity Advanced directives List of other physicians Hospitalizations, surgeries, and ER visits in previous 12 months Vitals Screenings to include cognitive, depression, and falls Referrals and appointments  In addition, I have reviewed and discussed with patient certain preventive protocols, quality metrics, and best practice recommendations. A written personalized care plan for preventive services as well as general preventive health recommendations were provided to patient.     Telford Nab, CMA   07/11/2022   Nurse Notes:  [name] , Thank you for taking time to come for your Medicare  Wellness Visit. I appreciate your ongoing commitment to your health goals. Please review the following plan we discussed and let me know if I can assist you in the future.   These are the goals we discussed:  Goals      Medication Management     Patient Goals/Self-Care Activities Patient will:  Take medications as prescribed Check blood pressure at least once daily, document, and provide at future appointments Collaborate with provider on medication access solutions Target a minimum of 150 minutes of moderate intensity exercise weekly         This is a list of the screening recommended for you and due dates:  Health Maintenance  Topic Date Due   Zoster (Shingles) Vaccine (1 of 2) Never done   Tetanus Vaccine  10/28/2020   COVID-19 Vaccine (4 - Moderna series) 03/07/2022   Flu Shot  05/28/2022   Mammogram  08/02/2022   Pneumonia Vaccine  Completed   DEXA scan (bone density measurement)  Completed   Hepatitis C Screening: USPSTF Recommendation to screen - Ages 7-79 yo.  Completed   HPV Vaccine  Aged Out   Colon Cancer Screening  Discontinued

## 2022-07-11 NOTE — Patient Instructions (Signed)
Tonya Pacheco , Thank you for taking time to come for your Medicare Wellness Visit. I appreciate your ongoing commitment to your health goals. Please review the following plan we discussed and let me know if I can assist you in the future.   Screening recommendations/referrals:  Recommended yearly ophthalmology/optometry visit for glaucoma screening and checkup Recommended yearly dental visit for hygiene and checkup  Vaccinations: Influenza vaccine: Due Pneumococcal vaccine: Completed Tdap vaccine: Due Shingles vaccine: Due    Advanced directives: Copy requested  Conditions/risks identified: falls, hypertension  Next appointment: 1 year   Preventive Care 65 Years and Older, Female Preventive care refers to lifestyle choices and visits with your health care provider that can promote health and wellness. What does preventive care include? A yearly physical exam. This is also called an annual well check. Dental exams once or twice a year. Routine eye exams. Ask your health care provider how often you should have your eyes checked. Personal lifestyle choices, including: Daily care of your teeth and gums. Regular physical activity. Eating a healthy diet. Avoiding tobacco and drug use. Limiting alcohol use. Practicing safe sex. Taking low-dose aspirin every day. Taking vitamin and mineral supplements as recommended by your health care provider. What happens during an annual well check? The services and screenings done by your health care provider during your annual well check will depend on your age, overall health, lifestyle risk factors, and family history of disease. Counseling  Your health care provider may ask you questions about your: Alcohol use. Tobacco use. Drug use. Emotional well-being. Home and relationship well-being. Sexual activity. Eating habits. History of falls. Memory and ability to understand (cognition). Work and work Astronomer. Reproductive  health. Screening  You may have the following tests or measurements: Height, weight, and BMI. Blood pressure. Lipid and cholesterol levels. These may be checked every 5 years, or more frequently if you are over 67 years old. Skin check. Lung cancer screening. You may have this screening every year starting at age 84 if you have a 30-pack-year history of smoking and currently smoke or have quit within the past 15 years. Fecal occult blood test (FOBT) of the stool. You may have this test every year starting at age 71. Flexible sigmoidoscopy or colonoscopy. You may have a sigmoidoscopy every 5 years or a colonoscopy every 10 years starting at age 67. Hepatitis C blood test. Hepatitis B blood test. Sexually transmitted disease (STD) testing. Diabetes screening. This is done by checking your blood sugar (glucose) after you have not eaten for a while (fasting). You may have this done every 1-3 years. Bone density scan. This is done to screen for osteoporosis. You may have this done starting at age 79. Mammogram. This may be done every 1-2 years. Talk to your health care provider about how often you should have regular mammograms. Talk with your health care provider about your test results, treatment options, and if necessary, the need for more tests. Vaccines  Your health care provider may recommend certain vaccines, such as: Influenza vaccine. This is recommended every year. Tetanus, diphtheria, and acellular pertussis (Tdap, Td) vaccine. You may need a Td booster every 10 years. Zoster vaccine. You may need this after age 5. Pneumococcal 13-valent conjugate (PCV13) vaccine. One dose is recommended after age 52. Pneumococcal polysaccharide (PPSV23) vaccine. One dose is recommended after age 93. Talk to your health care provider about which screenings and vaccines you need and how often you need them. This information is not intended to replace  advice given to you by your health care provider.  Make sure you discuss any questions you have with your health care provider. Document Released: 11/10/2015 Document Revised: 07/03/2016 Document Reviewed: 08/15/2015 Elsevier Interactive Patient Education  2017 [employer] Prevention in the Home Falls can cause injuries. They can happen to people of all ages. There are many things you can do to make your home safe and to help prevent falls. What can I do on the outside of my home? Regularly fix the edges of walkways and driveways and fix any cracks. Remove anything that might make you trip as you walk through a door, such as a raised step or threshold. Trim any bushes or trees on the path to your home. Use bright outdoor lighting. Clear any walking paths of anything that might make someone trip, such as rocks or tools. Regularly check to see if handrails are loose or broken. Make sure that both sides of any steps have handrails. Any raised decks and porches should have guardrails on the edges. Have any leaves, snow, or ice cleared regularly. Use sand or salt on walking paths during winter. Clean up any spills in your garage right away. This includes oil or grease spills. What can I do in the bathroom? Use night lights. Install grab bars by the toilet and in the tub and shower. Do not use towel bars as grab bars. Use non-skid mats or decals in the tub or shower. If you need to sit down in the shower, use a plastic, non-slip stool. Keep the floor dry. Clean up any water that spills on the floor as soon as it happens. Remove soap buildup in the tub or shower regularly. Attach bath mats securely with double-sided non-slip rug tape. Do not have throw rugs and other things on the floor that can make you trip. What can I do in the bedroom? Use night lights. Make sure that you have a light by your bed that is easy to reach. Do not use any sheets or blankets that are too big for your bed. They should not hang down onto the floor. Have a  firm chair that has side arms. You can use this for support while you get dressed. Do not have throw rugs and other things on the floor that can make you trip. What can I do in the kitchen? Clean up any spills right away. Avoid walking on wet floors. Keep items that you use a lot in easy-to-reach places. If you need to reach something above you, use a strong step stool that has a grab bar. Keep electrical cords out of the way. Do not use floor polish or wax that makes floors slippery. If you must use wax, use non-skid floor wax. Do not have throw rugs and other things on the floor that can make you trip. What can I do with my stairs? Do not leave any items on the stairs. Make sure that there are handrails on both sides of the stairs and use them. Fix handrails that are broken or loose. Make sure that handrails are as long as the stairways. Check any carpeting to make sure that it is firmly attached to the stairs. Fix any carpet that is loose or worn. Avoid having throw rugs at the top or bottom of the stairs. If you do have throw rugs, attach them to the floor with carpet tape. Make sure that you have a light switch at the top of the stairs and the bottom  of the stairs. If you do not have them, ask someone to add them for you. What else can I do to help prevent falls? Wear shoes that: Do not have high heels. Have rubber bottoms. Are comfortable and fit you well. Are closed at the toe. Do not wear sandals. If you use a stepladder: Make sure that it is fully opened. Do not climb a closed stepladder. Make sure that both sides of the stepladder are locked into place. Ask someone to hold it for you, if possible. Clearly mark and make sure that you can see: Any grab bars or handrails. First and last steps. Where the edge of each step is. Use tools that help you move around (mobility aids) if they are needed. These include: Canes. Walkers. Scooters. Crutches. Turn on the lights when you  go into a dark area. Replace any light bulbs as soon as they burn out. Set up your furniture so you have a clear path. Avoid moving your furniture around. If any of your floors are uneven, fix them. If there are any pets around you, be aware of where they are. Review your medicines with your doctor. Some medicines can make you feel dizzy. This can increase your chance of falling. Ask your doctor what other things that you can do to help prevent falls. This information is not intended to replace advice given to you by your health care provider. Make sure you discuss any questions you have with your health care provider. Document Released: 08/10/2009 Document Revised: 03/21/2016 Document Reviewed: 11/18/2014 Elsevier Interactive Patient Education  2017 [employer].

## 2022-09-13 ENCOUNTER — Other Ambulatory Visit: Payer: Self-pay | Admitting: Internal Medicine

## 2022-09-18 ENCOUNTER — Encounter: Payer: Medicare Other | Admitting: Internal Medicine

## 2022-09-26 ENCOUNTER — Other Ambulatory Visit: Payer: Self-pay | Admitting: Internal Medicine

## 2022-09-26 DIAGNOSIS — Z1231 Encounter for screening mammogram for malignant neoplasm of breast: Secondary | ICD-10-CM

## 2022-09-30 ENCOUNTER — Ambulatory Visit
Admission: RE | Admit: 2022-09-30 | Discharge: 2022-09-30 | Disposition: A | Discharge status: Home / Self Care | Payer: Medicare Other | Source: Ambulatory Visit | Attending: Internal Medicine | Admitting: Internal Medicine

## 2022-09-30 DIAGNOSIS — Z1231 Encounter for screening mammogram for malignant neoplasm of breast: Secondary | ICD-10-CM | POA: Diagnosis not present

## 2022-10-22 ENCOUNTER — Encounter: Payer: Medicare Other | Admitting: Internal Medicine

## 2022-10-30 ENCOUNTER — Other Ambulatory Visit (HOSPITAL_COMMUNITY): Payer: Medicare Other

## 2022-10-30 ENCOUNTER — Ambulatory Visit (HOSPITAL_COMMUNITY)
Admission: RE | Admit: 2022-10-30 | Discharge: 2022-10-30 | Disposition: A | Discharge status: Home / Self Care | Payer: Medicare Other | Source: Ambulatory Visit | Attending: Cardiology | Admitting: Cardiology

## 2022-10-30 DIAGNOSIS — I351 Nonrheumatic aortic (valve) insufficiency: Secondary | ICD-10-CM

## 2022-10-30 DIAGNOSIS — I359 Nonrheumatic aortic valve disorder, unspecified: Secondary | ICD-10-CM | POA: Insufficient documentation

## 2022-10-30 DIAGNOSIS — I350 Nonrheumatic aortic (valve) stenosis: Secondary | ICD-10-CM

## 2022-10-30 LAB — ECHOCARDIOGRAM COMPLETE
AR max vel: 1.17 cm2
AV Area VTI: 1.04 cm2
AV Area mean vel: 1.22 cm2
AV Mean grad: 18 mmHg
AV Peak grad: 30 mmHg
Ao pk vel: 2.74 m/s
Area-P 1/2: 1.94 cm2
P 1/2 time: 487 msec
S' Lateral: 1.8 cm

## 2022-10-30 NOTE — Progress Notes (Signed)
*  PRELIMINARY RESULTS* Echocardiogram 2D Echocardiogram has been performed.  Tonya Pacheco 10/30/2022, 4:02 PM

## 2022-11-01 ENCOUNTER — Telehealth: Payer: Self-pay | Admitting: Cardiology

## 2022-11-01 NOTE — Telephone Encounter (Signed)
Patient returned call for her echo results. She would like return call back this morning as the echo results will determine her accepting a new job.

## 2022-11-01 NOTE — Telephone Encounter (Signed)
Spoke with patient and gave her echo results per Dr. Gardiner Rhyme. Appointment made to see Dr. Gardiner Rhyme on 2/26.

## 2022-11-01 NOTE — Telephone Encounter (Signed)
Updated result note  Thanks!

## 2022-11-05 ENCOUNTER — Encounter: Payer: Medicare Other | Admitting: Internal Medicine

## 2022-11-06 ENCOUNTER — Other Ambulatory Visit: Payer: Self-pay | Admitting: Internal Medicine

## 2022-11-06 DIAGNOSIS — E7800 Pure hypercholesterolemia, unspecified: Secondary | ICD-10-CM

## 2022-11-18 ENCOUNTER — Encounter: Payer: Self-pay | Admitting: Internal Medicine

## 2022-11-18 ENCOUNTER — Ambulatory Visit (INDEPENDENT_AMBULATORY_CARE_PROVIDER_SITE_OTHER): Payer: Medicare Other | Admitting: Internal Medicine

## 2022-11-18 VITALS — BP 136/78 | HR 82 | Ht 67.0 in | Wt 166.8 lb

## 2022-11-18 DIAGNOSIS — I350 Nonrheumatic aortic (valve) stenosis: Secondary | ICD-10-CM

## 2022-11-18 DIAGNOSIS — Z2821 Immunization not carried out because of patient refusal: Secondary | ICD-10-CM

## 2022-11-18 DIAGNOSIS — E559 Vitamin D deficiency, unspecified: Secondary | ICD-10-CM

## 2022-11-18 DIAGNOSIS — Z0001 Encounter for general adult medical examination with abnormal findings: Secondary | ICD-10-CM

## 2022-11-18 DIAGNOSIS — I10 Essential (primary) hypertension: Secondary | ICD-10-CM

## 2022-11-18 DIAGNOSIS — E782 Mixed hyperlipidemia: Secondary | ICD-10-CM | POA: Diagnosis not present

## 2022-11-18 DIAGNOSIS — R7303 Prediabetes: Secondary | ICD-10-CM | POA: Diagnosis not present

## 2022-11-18 DIAGNOSIS — J0110 Acute frontal sinusitis, unspecified: Secondary | ICD-10-CM

## 2022-11-18 MED ORDER — AZITHROMYCIN 250 MG PO TABS: ORAL_TABLET | ORAL | 0 refills | Status: AC

## 2022-11-18 MED ORDER — NOREL AD 4-10-325 MG PO TABS: 1.0000 | ORAL_TABLET | Freq: Three times a day (TID) | ORAL | 0 refills | Status: DC | PRN

## 2022-11-18 NOTE — Assessment & Plan Note (Signed)
Annual exam as documented. Counseling done  re healthy lifestyle involving commitment to 150 minutes exercise per week, heart healthy diet, and attaining healthy weight.The importance of adequate sleep also discussed. Changes in health habits are decided on by the patient with goals and time frames  set for achieving them. Immunization and cancer screening needs are specifically addressed at this visit. 

## 2022-11-18 NOTE — Assessment & Plan Note (Signed)
Last lipid profile reviewed Did not tolerate statin and Zetia in the past On Repatha now Continue to follow low carb and low cholesterol diet

## 2022-11-18 NOTE — Patient Instructions (Signed)
Please continue taking medications as prescribed.  Please continue to follow low salt diet and perform moderate exercise/walking at least 150 mins/week.  Please consider getting Shingrix and Tdap at local pharmacy.

## 2022-11-18 NOTE — Assessment & Plan Note (Signed)
Lab Results  Component Value Date   HGBA1C 6.0 (H) 09/03/2021   Advised to follow low carb diet for now

## 2022-11-18 NOTE — Assessment & Plan Note (Addendum)
Echo reviewed Currently asymptomatic Repeat Echo in 1 year Followed by Cardiology

## 2022-11-18 NOTE — Assessment & Plan Note (Signed)
Started empiric Azithromycin as she has persistent symptoms Norel AD PRN for nasal congestion Advised to use vaporizer for nasal congestion

## 2022-11-18 NOTE — Progress Notes (Signed)
Established Patient Office Visit  Subjective:  Patient ID: Tonya Pacheco, female    DOB: 20-Apr-1946  Age: 77 y.o. MRN: [id number]  CC:  Chief Complaint  Patient presents with   Annual Exam    Patient has a headache along with a head cold, having mucus with congestion, blowing her nose, been going on since 11/04/2022    HPI Tonya Pacheco is a 77 y.o. female with past medical history of HTN, osteopenia and HLD who presents for annual physical.  She complains of nasal congestion, sinus pressure related headache and postnasal drip for the last 2 weeks.  She denies any fever, chills, dyspnea or wheezing.  She has started working at [employer] and has been around sick kids.  BP is well-controlled. Takes medications regularly. Patient denies headache, dizziness, chest pain, dyspnea or palpitations.  She has a history of HLD, but had myositis with statin.  Past Medical History:  Diagnosis Date   Hypertension     Past Surgical History:  Procedure Laterality Date   COLONOSCOPY WITH PROPOFOL N/A 09/15/2020   Procedure: COLONOSCOPY WITH PROPOFOL;  Surgeon: Dolores Frame, MD;  Location: [facility];  Service: Gastroenterology;  Laterality: N/A;  915    Family History  Problem Relation Age of Onset   Arthritis Sister 53       Rheumatoid Arthritis   Heart disease Sister    Lupus Sister    Cancer Maternal Grandmother    Kidney disease Brother 58       Kidney transplant   Breast cancer Neg Hx     Social History   Socioeconomic History   Marital status: Widowed    Spouse name: Not on file   Number of children: Not on file   Years of education: Not on file   Highest education level: Not on file  Occupational History   Not on file  Tobacco Use   Smoking status: Never   Smokeless tobacco: Never  Vaping Use   Vaping Use: Never used  Substance and Sexual Activity   Alcohol use: No   Drug use: No   Sexual activity: Not Currently  Other Topics Concern   Not on  file  Social History Narrative   Not on file   Social Determinants of Health   Financial Resource Strain: Low Risk  (07/11/2022)   Overall Financial Resource Strain (CARDIA)    Difficulty of Paying Living Expenses: Not hard at all  Food Insecurity: No Food Insecurity (07/11/2022)   Hunger Vital Sign    Worried About Running Out of Food in the Last Year: Never true    Ran Out of Food in the Last Year: Never true  Transportation Needs: No Transportation Needs (07/11/2022)   PRAPARE - Administrator, Civil Service (Medical): No    Lack of Transportation (Non-Medical): No  Physical Activity: Sufficiently Active (07/11/2022)   Exercise Vital Sign    Days of Exercise per Week: 6 days    Minutes of Exercise per Session: 30 min  Stress: No Stress Concern Present (07/11/2022)   [employer] of Occupational Health - Occupational Stress Questionnaire    Feeling of Stress : Not at all  Social Connections: Moderately Isolated (07/11/2022)   Social Connection and Isolation Panel [NHANES]    Frequency of Communication with Friends and Family: More than three times a week    Frequency of Social Gatherings with Friends and Family: Three times a week    Attends Religious Services: More than 4  times per year    Active Member of Clubs or Organizations: No    Attends Archivist Meetings: Never    Marital Status: Widowed  Intimate Partner Violence: Not At Risk (07/11/2022)   Humiliation, Afraid, Rape, and Kick questionnaire    Fear of Current or Ex-Partner: No    Emotionally Abused: No    Physically Abused: No    Sexually Abused: No    Outpatient Medications Prior to Visit  Medication Sig Dispense Refill   amLODipine (NORVASC) 5 MG tablet TAKE 1 TABLET(5 MG) BY MOUTH DAILY 90 tablet 0   aspirin 81 MG tablet Take 81 mg by mouth daily.     calcium carbonate (OS-CAL) 600 MG TABS tablet Take 600 mg by mouth every evening.      Cholecalciferol (D3 2000 PO) Take 2,000 Units by  mouth daily.     Multiple Vitamins-Minerals (CENTRUM ADULTS PO) Take 1 tablet by mouth every evening.      REPATHA SURECLICK 540 MG/ML SOAJ ADMINISTER 1 ML UNDER THE SKIN EVERY 14 DAYS 2 mL 11   No facility-administered medications prior to visit.    Allergies  Allergen Reactions   Penicillins Swelling    Swelling mouth/ tongue    Zetia [Ezetimibe] Swelling    Eyes puffy, tongue swelling   Statins Other (See Comments)    Muscle/joint aches    ROS Review of Systems  Constitutional:  Negative for chills and fever.  HENT:  Positive for congestion, postnasal drip, sinus pressure and sinus pain. Negative for sore throat.   Eyes:  Negative for pain and discharge.  Respiratory:  Negative for cough and shortness of breath.   Cardiovascular:  Negative for chest pain and palpitations.  Gastrointestinal:  Negative for abdominal pain, constipation, diarrhea, nausea and vomiting.  Endocrine: Negative for polydipsia and polyuria.  Genitourinary:  Negative for dysuria and hematuria.  Musculoskeletal:  Negative for neck pain and neck stiffness.  Skin:  Negative for rash.  Neurological:  Positive for headaches. Negative for dizziness and weakness.  Psychiatric/Behavioral:  Negative for agitation and behavioral problems.       Objective:    Physical Exam Vitals reviewed.  Constitutional:      General: She is not in acute distress.    Appearance: She is not diaphoretic.  HENT:     Head: Normocephalic and atraumatic.     Nose: Congestion present.     Mouth/Throat:     Mouth: Mucous membranes are moist.  Eyes:     General: No scleral icterus.    Extraocular Movements: Extraocular movements intact.  Cardiovascular:     Rate and Rhythm: Normal rate and regular rhythm.     Pulses: Normal pulses.     Heart sounds: Murmur (Systolic, most profound at left upper sternal border) heard.  Pulmonary:     Breath sounds: Normal breath sounds. No wheezing or rales.  Abdominal:     Palpations:  Abdomen is soft.     Tenderness: There is no abdominal tenderness.  Musculoskeletal:     Cervical back: Neck supple. No tenderness.     Right lower leg: No edema.     Left lower leg: No edema.  Skin:    General: Skin is warm.     Findings: No rash.  Neurological:     General: No focal deficit present.     Mental Status: She is alert and oriented to person, place, and time.     Cranial Nerves: No cranial nerve deficit.  Sensory: No sensory deficit.     Motor: No weakness.  Psychiatric:        Mood and Affect: Mood normal.        Behavior: Behavior normal.     BP 136/78 (BP Location: Left Arm, Cuff Size: Normal)   Pulse 82   Ht 5\' 7"  (1.702 m)   Wt 166 lb 12.8 oz (75.7 kg)   SpO2 97%   BMI 26.12 kg/m  Wt Readings from Last 3 Encounters:  11/18/22 166 lb 12.8 oz (75.7 kg)  04/19/22 164 lb (74.4 kg)  04/09/22 164 lb 3.2 oz (74.5 kg)    Lab Results  Component Value Date   TSH 3.050 09/03/2021   Lab Results  Component Value Date   WBC 5.1 09/03/2021   HGB 13.4 09/03/2021   HCT 40.4 09/03/2021   MCV 92 09/03/2021   PLT 230 09/03/2021   Lab Results  Component Value Date   NA 141 03/20/2022   K 4.6 03/20/2022   CO2 24 03/20/2022   GLUCOSE 90 03/20/2022   BUN 17 03/20/2022   CREATININE 1.00 03/20/2022   BILITOT 0.4 09/03/2021   ALKPHOS 90 09/03/2021   AST 19 09/03/2021   ALT 13 09/03/2021   PROT 7.2 09/03/2021   ALBUMIN 4.5 09/03/2021   CALCIUM 9.3 03/20/2022   ANIONGAP 6 07/01/2018   EGFR 59 (L) 03/20/2022   Lab Results  Component Value Date   CHOL 160 03/20/2022   Lab Results  Component Value Date   HDL 69 03/20/2022   Lab Results  Component Value Date   LDLCALC 80 03/20/2022   Lab Results  Component Value Date   TRIG 55 03/20/2022   Lab Results  Component Value Date   CHOLHDL 2.3 03/20/2022   Lab Results  Component Value Date   HGBA1C 6.0 (H) 09/03/2021      Assessment & Plan:   Problem List Items Addressed This Visit     Encounter for general adult medical examination with abnormal findings Annual exam as documented. Counseling done  re healthy lifestyle involving commitment to 150 minutes exercise per week, heart healthy diet, and attaining healthy weight.The importance of adequate sleep also discussed. Changes in health habits are decided on by the patient with goals and time frames  set for achieving them. Immunization and cancer screening needs are specifically addressed at this visit.  Essential hypertension, benign BP Readings from Last 1 Encounters:  11/18/22 136/78   Well-controlled with Amlodipine 5 mg QD Counseled for compliance with the medications Advised DASH diet and moderate exercise/walking, at least 150 mins/week  Nonrheumatic aortic valve stenosis Echo reviewed Currently asymptomatic Repeat Echo in 1 year Followed by Cardiology  Hyperlipidemia Last lipid profile reviewed Did not tolerate statin and Zetia in the past On Repatha now Continue to follow low carb and low cholesterol diet  Prediabetes Lab Results  Component Value Date   HGBA1C 6.0 (H) 09/03/2021   Advised to follow low carb diet for now  Acute frontal sinusitis Started empiric Azithromycin as she has persistent symptoms Norel AD PRN for nasal congestion Advised to use vaporizer for nasal congestion    Meds ordered this encounter  Medications   azithromycin (ZITHROMAX) 250 MG tablet    Sig: Take 2 tablets on day 1, then 1 tablet daily on days 2 through 5    Dispense:  6 tablet    Refill:  0   Chlorphen-PE-Acetaminophen (NOREL AD) 4-10-325 MG TABS    Sig: Take 1 tablet  by mouth 3 (three) times daily as needed (Nasal congestion).    Dispense:  30 tablet    Refill:  0    Follow-up: Return in about 6 months (around 05/19/2023) for HTN.    Lindell Spar, MD

## 2022-11-18 NOTE — Assessment & Plan Note (Addendum)
BP Readings from Last 1 Encounters:  11/18/22 136/78   Well-controlled with Amlodipine 5 mg QD Counseled for compliance with the medications Advised DASH diet and moderate exercise/walking, at least 150 mins/week

## 2022-11-19 LAB — LIPID PANEL
Chol/HDL Ratio: 2.2 ratio (ref 0.0–4.4)
Cholesterol, Total: 141 mg/dL (ref 100–199)
HDL: 63 mg/dL (ref >39)
LDL Chol Calc (NIH): 66 mg/dL (ref 0–99)
Triglycerides: 56 mg/dL (ref 0–149)
VLDL Cholesterol Cal: 12 mg/dL (ref 5–40)

## 2022-11-19 LAB — CMP14+EGFR
ALT: 10 IU/L (ref 0–32)
AST: 12 IU/L (ref 0–40)
Albumin/Globulin Ratio: 1.5 (ref 1.2–2.2)
Albumin: 4.2 g/dL (ref 3.8–4.8)
Alkaline Phosphatase: 91 IU/L (ref 44–121)
BUN/Creatinine Ratio: 15 (ref 12–28)
BUN: 12 mg/dL (ref 8–27)
Bilirubin Total: 0.4 mg/dL (ref 0.0–1.2)
CO2: 26 mmol/L (ref 20–29)
Calcium: 9.2 mg/dL (ref 8.7–10.3)
Chloride: 101 mmol/L (ref 96–106)
Creatinine, Ser: 0.78 mg/dL (ref 0.57–1.00)
Globulin, Total: 2.8 g/dL (ref 1.5–4.5)
Glucose: 91 mg/dL (ref 70–99)
Potassium: 4.3 mmol/L (ref 3.5–5.2)
Sodium: 140 mmol/L (ref 134–144)
Total Protein: 7 g/dL (ref 6.0–8.5)
eGFR: 80 mL/min/{1.73_m2} (ref >59)

## 2022-11-19 LAB — CBC WITH DIFFERENTIAL/PLATELET
Basophils Absolute: 0 10*3/uL (ref 0.0–0.2)
Basos: 0 %
EOS (ABSOLUTE): 0.2 10*3/uL (ref 0.0–0.4)
Eos: 3 %
Hematocrit: 36.5 % (ref 34.0–46.6)
Hemoglobin: 11.8 g/dL (ref 11.1–15.9)
Immature Grans (Abs): 0 10*3/uL (ref 0.0–0.1)
Immature Granulocytes: 0 %
Lymphocytes Absolute: 2.1 10*3/uL (ref 0.7–3.1)
Lymphs: 31 %
MCH: 29.6 pg (ref 26.6–33.0)
MCHC: 32.3 g/dL (ref 31.5–35.7)
MCV: 92 fL (ref 79–97)
Monocytes Absolute: 0.6 10*3/uL (ref 0.1–0.9)
Monocytes: 9 %
Neutrophils Absolute: 3.9 10*3/uL (ref 1.4–7.0)
Neutrophils: 57 %
Platelets: 304 10*3/uL (ref 150–450)
RBC: 3.99 x10E6/uL (ref 3.77–5.28)
RDW: 12.4 % (ref 11.7–15.4)
WBC: 6.8 10*3/uL (ref 3.4–10.8)

## 2022-11-19 LAB — TSH: TSH: 2.76 u[IU]/mL (ref 0.450–4.500)

## 2022-11-19 LAB — HEMOGLOBIN A1C
Est. average glucose Bld gHb Est-mCnc: 134 mg/dL
Hgb A1c MFr Bld: 6.3 % — ABNORMAL HIGH (ref 4.8–5.6)

## 2022-11-19 LAB — VITAMIN D 25 HYDROXY (VIT D DEFICIENCY, FRACTURES): Vit D, 25-Hydroxy: 42.1 ng/mL (ref 30.0–100.0)

## 2022-12-12 ENCOUNTER — Other Ambulatory Visit: Payer: Self-pay | Admitting: Internal Medicine

## 2022-12-22 NOTE — Progress Notes (Deleted)
Cardiology Office Note:    Date:  12/22/2022   ID:  Tonya Pacheco, DOB 22-Nov-1945, MRN [id number]  PCP:  Tonya Spar, MD  Cardiologist:  None  Electrophysiologist:  None   Referring MD: Tonya Spar, MD   No chief complaint on file.   History of Present Illness:    Tonya Pacheco is a 77 y.o. female with a hx of hypertension, hyperlipidemia who presents for follow-up.  She was referred by Dr. Posey Pronto for evaluation of heart murmur, initially seen on 11/02/2021.  She reports that she is doing well.  Denies any chest pain, dyspnea, lightheadedness, syncope, lower extremity edema, or palpitations.  She does yard work for exercise.  Frequently will walk up to 15,000 steps per day.  She reports intolerance to multiple statins due to muscle aches.  No smoking history.  No history of heart disease in immediate family.  Echocardiogram 11/26/2021 showed normal biventricular function, grade 1 diastolic dysfunction, moderate left atrial enlargement, mild MR, moderate AS.  Calcium score 12/06/2021 with 210 (83rd percentile).  Echocardiogram 10/30/2022 showed EF 60 to 123456, grade 1 diastolic dysfunction, normal RV function, moderate to severe aortic stenosis, appears more moderate by my red (Vmax 2.7 m/s, MG 18 mmHg, AVA 1.1 cm^2, DI 0.33).  Since last clinic visit,  she reports she is doing well.  Denies any chest pain, dyspnea, lightheadedness, syncope, lower extremity edema, or palpitations.  Exercises 15-30 minutes every morning.  No exertional symptoms.     Past Medical History:  Diagnosis Date   Hypertension     Past Surgical History:  Procedure Laterality Date   COLONOSCOPY WITH PROPOFOL N/A 09/15/2020   Procedure: COLONOSCOPY WITH PROPOFOL;  Surgeon: Harvel Quale, MD;  Location: [facility];  Service: Gastroenterology;  Laterality: N/A;  915    Current Medications: No outpatient medications have been marked as taking for the 12/23/22 encounter (Appointment) with Donato Heinz, MD.     Allergies:   Penicillins, Zetia [ezetimibe], and Statins   Social History   Socioeconomic History   Marital status: Widowed    Spouse name: Not on file   Number of children: Not on file   Years of education: Not on file   Highest education level: Not on file  Occupational History   Not on file  Tobacco Use   Smoking status: Never   Smokeless tobacco: Never  Vaping Use   Vaping Use: Never used  Substance and Sexual Activity   Alcohol use: No   Drug use: No   Sexual activity: Not Currently  Other Topics Concern   Not on file  Social History Narrative   Not on file   Social Determinants of Health   Financial Resource Strain: Low Risk  (07/11/2022)   Overall Financial Resource Strain (CARDIA)    Difficulty of Paying Living Expenses: Not hard at all  Food Insecurity: No Food Insecurity (07/11/2022)   Hunger Vital Sign    Worried About Running Out of Food in the Last Year: Never true    Ran Out of Food in the Last Year: Never true  Transportation Needs: No Transportation Needs (07/11/2022)   PRAPARE - Hydrologist (Medical): No    Lack of Transportation (Non-Medical): No  Physical Activity: Sufficiently Active (07/11/2022)   Exercise Vital Sign    Days of Exercise per Week: 6 days    Minutes of Exercise per Session: 30 min  Stress: No Stress Concern Present (07/11/2022)  [employer] of Occupational Health - Occupational Stress Questionnaire    Feeling of Stress : Not at all  Social Connections: Moderately Isolated (07/11/2022)   Social Connection and Isolation Panel [NHANES]    Frequency of Communication with Friends and Family: More than three times a week    Frequency of Social Gatherings with Friends and Family: Three times a week    Attends Religious Services: More than 4 times per year    Active Member of Clubs or Organizations: No    Attends Archivist Meetings: Never    Marital Status: Widowed      Family History: The patient's family history includes Arthritis (age of onset: 32) in her sister; Cancer in her maternal grandmother; Heart disease in her sister; Kidney disease (age of onset: 65) in her brother; Lupus in her sister. There is no history of Breast cancer.  ROS:   Please see the history of present illness.     All other systems reviewed and are negative.  EKGs/Labs/Other Studies Reviewed:    The following studies were reviewed today:   EKG:  EKG is  ordered today.  The ekg ordered today demonstrates normal sinus rhythm, rate 73, no ST abnormalities  Recent Labs: 11/18/2022: ALT 10; BUN 12; Creatinine, Ser 0.78; Hemoglobin 11.8; Platelets 304; Potassium 4.3; Sodium 140; TSH 2.760  Recent Lipid Panel    Component Value Date/Time   CHOL 141 11/18/2022 1611   TRIG 56 11/18/2022 1611   HDL 63 11/18/2022 1611   CHOLHDL 2.2 11/18/2022 1611   CHOLHDL 4.1 03/29/2020 0832   VLDL 18 07/30/2016 1007   LDLCALC 66 11/18/2022 1611   LDLCALC 201 (H) 03/29/2020 0832    Physical Exam:    VS:  There were no vitals taken for this visit.    Wt Readings from Last 3 Encounters:  11/18/22 166 lb 12.8 oz (75.7 kg)  04/19/22 164 lb (74.4 kg)  04/09/22 164 lb 3.2 oz (74.5 kg)     GEN:  Well nourished, well developed in no acute distress HEENT: Normal NECK: No JVD; No carotid bruits CARDIAC: RRR, 2 out of 6 systolic murmur RESPIRATORY:  Clear to auscultation without rales, wheezing or rhonchi  ABDOMEN: Soft, non-tender, non-distended MUSCULOSKELETAL:  No edema; No deformity  SKIN: Warm and dry NEUROLOGIC:  Alert and oriented x 3 PSYCHIATRIC:  Normal affect   ASSESSMENT:    No diagnosis found.   PLAN:    Aortic stenosis: Moderate on echocardiogram 11/26/2021.  Echocardiogram 10/30/2022 showed EF 60 to 123456, grade 1 diastolic dysfunction, normal RV function, moderate to severe aortic stenosis, appears more moderate by my red (Vmax 2.7 m/s, MG 18 mmHg, AVA 1.1 cm^2, DI  0.33). Hypertension: On amlodipine 5 mg daily.  Appears controlled.   Hyperlipidemia: Did not tolerate statin or Zetia.  LDL 202 on 09/03/2021.  Calcium score 12/06/2021 with 210 (83rd percentile).  Likely FH.  Started on [place] with significant improvement, LDL 80 on 03/20/2022 -Continue Repatha  RTC in ***  Medication Adjustments/Labs and Tests Ordered: Current medicines are reviewed at length with the patient today.  Concerns regarding medicines are outlined above.  No orders of the defined types were placed in this encounter.  No orders of the defined types were placed in this encounter.   There are no Patient Instructions on file for this visit.   Signed, Donato Heinz, MD  12/22/2022 3:16 PM    [place]

## 2022-12-23 ENCOUNTER — Ambulatory Visit: Payer: Medicare Other | Attending: Cardiology | Admitting: Cardiology

## 2022-12-23 ENCOUNTER — Ambulatory Visit: Payer: Medicare Other | Admitting: Cardiology

## 2023-03-03 NOTE — Progress Notes (Unsigned)
Cardiology Office Note:    Date:  03/06/2023   ID:  Tonya Pacheco, DOB 1945/11/23, MRN [id number]  PCP:  Anabel Halon, MD  Cardiologist:  None  Electrophysiologist:  None   Referring MD: Anabel Halon, MD   Chief Complaint  Patient presents with   Aortic Stenosis    History of Present Illness:    Tonya Pacheco is a 77 y.o. female with a hx of hypertension, hyperlipidemia who presents for follow-up.  She was referred by Dr. Allena Katz for evaluation of heart murmur, initially seen on 11/02/2021.  She reports that she is doing well.  Denies any chest pain, dyspnea, lightheadedness, syncope, lower extremity edema, or palpitations.  She does yard work for exercise.  Frequently will walk up to 15,000 steps per day.  She reports intolerance to multiple statins due to muscle aches.  No smoking history.  No history of heart disease in immediate family.  Echocardiogram 11/26/2021 showed normal biventricular function, grade 1 diastolic dysfunction, moderate left atrial enlargement, mild MR, moderate AS.  Calcium score 12/06/2021 with 210 (83rd percentile).  Echocardiogram 10/2022 showed EF 60 to 65%, normal RV function, moderate to severe aortic stenosis (V-max 2.7 m/s, mean gradient 18 mmHg, AVA 1.0 cm, DI 0.33).  Since last clinic visit, she reports that she is doing well.  Denies any chest pain, dyspnea, lightheadedness, syncope, lower extremity edema, or palpitations.  Exercises 30 minutes every morning, denies any exertional symptoms.   BP Readings from Last 3 Encounters:  03/06/23 (!) 150/80  11/18/22 136/78  04/19/22 118/76      Past Medical History:  Diagnosis Date   Hypertension     Past Surgical History:  Procedure Laterality Date   COLONOSCOPY WITH PROPOFOL N/A 09/15/2020   Procedure: COLONOSCOPY WITH PROPOFOL;  Surgeon: Dolores Frame, MD;  Location: [facility];  Service: Gastroenterology;  Laterality: N/A;  915    Current Medications: Current Meds  Medication Sig    amLODipine (NORVASC) 5 MG tablet TAKE 1 TABLET(5 MG) BY MOUTH DAILY   aspirin 81 MG tablet Take 81 mg by mouth daily.   calcium carbonate (OS-CAL) 600 MG TABS tablet Take 600 mg by mouth every evening.    Cholecalciferol (D3 2000 PO) Take 2,000 Units by mouth daily.   Multiple Vitamins-Minerals (CENTRUM ADULTS PO) Take 1 tablet by mouth every evening.    REPATHA SURECLICK 140 MG/ML SOAJ ADMINISTER 1 ML UNDER THE SKIN EVERY 14 DAYS     Allergies:   Penicillins, Zetia [ezetimibe], and Statins   Social History   Socioeconomic History   Marital status: Widowed    Spouse name: Not on file   Number of children: Not on file   Years of education: Not on file   Highest education level: Not on file  Occupational History   Not on file  Tobacco Use   Smoking status: Never   Smokeless tobacco: Never  Vaping Use   Vaping Use: Never used  Substance and Sexual Activity   Alcohol use: No   Drug use: No   Sexual activity: Not Currently  Other Topics Concern   Not on file  Social History Narrative   Not on file   Social Determinants of Health   Financial Resource Strain: Low Risk  (07/11/2022)   Overall Financial Resource Strain (CARDIA)    Difficulty of Paying Living Expenses: Not hard at all  Food Insecurity: No Food Insecurity (07/11/2022)   Hunger Vital Sign    Worried About Running Out  of Food in the Last Year: Never true    Ran Out of Food in the Last Year: Never true  Transportation Needs: No Transportation Needs (07/11/2022)   PRAPARE - Administrator, Civil Service (Medical): No    Lack of Transportation (Non-Medical): No  Physical Activity: Sufficiently Active (07/11/2022)   Exercise Vital Sign    Days of Exercise per Week: 6 days    Minutes of Exercise per Session: 30 min  Stress: No Stress Concern Present (07/11/2022)   [employer] of Occupational Health - Occupational Stress Questionnaire    Feeling of Stress : Not at all  Social Connections: Moderately  Isolated (07/11/2022)   Social Connection and Isolation Panel [NHANES]    Frequency of Communication with Friends and Family: More than three times a week    Frequency of Social Gatherings with Friends and Family: Three times a week    Attends Religious Services: More than 4 times per year    Active Member of Clubs or Organizations: No    Attends Banker Meetings: Never    Marital Status: Widowed     Family History: The patient's family history includes Arthritis (age of onset: 32) in her sister; Cancer in her maternal grandmother; Heart disease in her sister; Kidney disease (age of onset: 54) in her brother; Lupus in her sister. There is no history of Breast cancer.  ROS:   Please see the history of present illness.     All other systems reviewed and are negative.  EKGs/Labs/Other Studies Reviewed:    The following studies were reviewed today:   EKG:   03/06/2023: Normal sinus rhythm, rate 81, no ST abnormality  Recent Labs: 11/18/2022: ALT 10; BUN 12; Creatinine, Ser 0.78; Hemoglobin 11.8; Platelets 304; Potassium 4.3; Sodium 140; TSH 2.760  Recent Lipid Panel    Component Value Date/Time   CHOL 141 11/18/2022 1611   TRIG 56 11/18/2022 1611   HDL 63 11/18/2022 1611   CHOLHDL 2.2 11/18/2022 1611   CHOLHDL 4.1 03/29/2020 0832   VLDL 18 07/30/2016 1007   LDLCALC 66 11/18/2022 1611   LDLCALC 201 (H) 03/29/2020 0832    Physical Exam:    VS:  BP (!) 150/80 (BP Location: Left Arm, Patient Position: Sitting, Cuff Size: Large)   Pulse 81   Ht 5' 7.5" (1.715 m)   Wt 169 lb 12.8 oz (77 kg)   SpO2 97%   BMI 26.20 kg/m     Wt Readings from Last 3 Encounters:  03/06/23 169 lb 12.8 oz (77 kg)  11/18/22 166 lb 12.8 oz (75.7 kg)  04/19/22 164 lb (74.4 kg)     GEN:  Well nourished, well developed in no acute distress HEENT: Normal NECK: No JVD; No carotid bruits CARDIAC: RRR, 2 out of 6 systolic murmur RESPIRATORY:  Clear to auscultation without rales, wheezing or  rhonchi  ABDOMEN: Soft, non-tender, non-distended MUSCULOSKELETAL:  No edema; No deformity  SKIN: Warm and dry NEUROLOGIC:  Alert and oriented x 3 PSYCHIATRIC:  Normal affect   ASSESSMENT:    1. Aortic valve stenosis, etiology of cardiac valve disease unspecified   2. Essential hypertension, benign   3. Hyperlipidemia, unspecified hyperlipidemia type      PLAN:    Aortic stenosis: Moderate on echocardiogram 11/26/2021.   Echocardiogram 10/2022 showed EF 60 to 65%, normal RV function, moderate to severe aortic stenosis (V-max 2.7 m/s, mean gradient 18 mmHg, AVA 1.0 cm, DI 0.33). -Remains asymptomatic, appears moderate AS on  echo.  Plan repeat echocardiogram in 1 year  Hypertension: On amlodipine 5 mg daily.  Mildly elevated in clinic today.  Recommend checking BP twice daily for next week and calling with results  Hyperlipidemia: Did not tolerate statin or Zetia.  LDL 202 on 09/03/2021.  Calcium score 12/06/2021 with 210 (83rd percentile).  Likely FH.  Started on Repatha with significant improvement, LDL 66 on 11/18/2022 -Continue Repatha  RTC in 8 months  Medication Adjustments/Labs and Tests Ordered: Current medicines are reviewed at length with the patient today.  Concerns regarding medicines are outlined above.  Orders Placed This Encounter  Procedures   EKG 12-Lead   ECHOCARDIOGRAM COMPLETE   No orders of the defined types were placed in this encounter.   Patient Instructions  Medication Instructions:  Your physician recommends that you continue on your current medications as directed. Please refer to the Current Medication list given to you today.  *If you need a refill on your cardiac medications before your next appointment, please call your pharmacy*  Testing/Procedures: Your physician has requested that you have an echocardiogram in January 2025. Echocardiography is a painless test that uses sound waves to create images of your heart. It provides your doctor with  information about the size and shape of your heart and how well your heart's chambers and valves are working. This procedure takes approximately one hour. There are no restrictions for this procedure. Please do NOT wear cologne, perfume, aftershave, or lotions (deodorant is allowed). Please arrive 15 minutes prior to your appointment time.  Follow-Up: At [facility], you and your health needs are our priority.  As part of our continuing mission to provide you with exceptional heart care, we have created designated Provider Care Teams.  These Care Teams include your primary Cardiologist (physician) and Advanced Practice Providers (APPs -  Physician Assistants and Nurse Practitioners) who all work together to provide you with the care you need, when you need it.  We recommend signing up for the patient portal called "MyChart".  Sign up information is provided on this After Visit Summary.  MyChart is used to connect with patients for Virtual Visits (Telemedicine).  Patients are able to view lab/test results, encounter notes, upcoming appointments, etc.  Non-urgent messages can be sent to your provider as well.   To learn more about what you can do with MyChart, go to [email or web address].    Your next appointment:   January with Dr. Bjorn Pippin  Other Instructions Recommend Omron upper arm blood pressure cuff  Please check your blood pressure at home twice daily, write it down.  Call the office or send message via Mychart with the readings in 1 week for Dr. Bjorn Pippin to review.      Signed, Little Ishikawa, MD  03/06/2023 5:35 PM    [facility]

## 2023-03-06 ENCOUNTER — Encounter: Payer: Self-pay | Admitting: Cardiology

## 2023-03-06 ENCOUNTER — Ambulatory Visit: Payer: Medicare Other | Attending: Cardiology | Admitting: Cardiology

## 2023-03-06 VITALS — BP 150/80 | HR 81 | Ht 67.5 in | Wt 169.8 lb

## 2023-03-06 DIAGNOSIS — E785 Hyperlipidemia, unspecified: Secondary | ICD-10-CM

## 2023-03-06 DIAGNOSIS — I350 Nonrheumatic aortic (valve) stenosis: Secondary | ICD-10-CM

## 2023-03-06 DIAGNOSIS — I10 Essential (primary) hypertension: Secondary | ICD-10-CM | POA: Diagnosis not present

## 2023-03-06 NOTE — Patient Instructions (Addendum)
Medication Instructions:  Your physician recommends that you continue on your current medications as directed. Please refer to the Current Medication list given to you today.  *If you need a refill on your cardiac medications before your next appointment, please call your pharmacy*  Testing/Procedures: Your physician has requested that you have an echocardiogram in January 2025. Echocardiography is a painless test that uses sound waves to create images of your heart. It provides your doctor with information about the size and shape of your heart and how well your heart's chambers and valves are working. This procedure takes approximately one hour. There are no restrictions for this procedure. Please do NOT wear cologne, perfume, aftershave, or lotions (deodorant is allowed). Please arrive 15 minutes prior to your appointment time.  Follow-Up: At [facility], you and your health needs are our priority.  As part of our continuing mission to provide you with exceptional heart care, we have created designated Provider Care Teams.  These Care Teams include your primary Cardiologist (physician) and Advanced Practice Providers (APPs -  Physician Assistants and Nurse Practitioners) who all work together to provide you with the care you need, when you need it.  We recommend signing up for the patient portal called "MyChart".  Sign up information is provided on this After Visit Summary.  MyChart is used to connect with patients for Virtual Visits (Telemedicine).  Patients are able to view lab/test results, encounter notes, upcoming appointments, etc.  Non-urgent messages can be sent to your provider as well.   To learn more about what you can do with MyChart, go to [email or web address].    Your next appointment:   January with Dr. Bjorn Pippin  Other Instructions Recommend Omron upper arm blood pressure cuff  Please check your blood pressure at home twice daily, write it down.  Call the office  or send message via Mychart with the readings in 1 week for Dr. Bjorn Pippin to review.

## 2023-03-10 ENCOUNTER — Other Ambulatory Visit: Payer: Self-pay | Admitting: Internal Medicine

## 2023-05-05 DIAGNOSIS — K080 Exfoliation of teeth due to systemic causes: Secondary | ICD-10-CM | POA: Diagnosis not present

## 2023-05-21 ENCOUNTER — Ambulatory Visit: Payer: Medicare Other | Admitting: Internal Medicine

## 2023-06-07 ENCOUNTER — Other Ambulatory Visit: Payer: Self-pay | Admitting: Internal Medicine

## 2023-06-17 ENCOUNTER — Ambulatory Visit (INDEPENDENT_AMBULATORY_CARE_PROVIDER_SITE_OTHER): Payer: Medicare Other | Admitting: Internal Medicine

## 2023-06-17 ENCOUNTER — Encounter: Payer: Self-pay | Admitting: Internal Medicine

## 2023-06-17 VITALS — BP 144/78 | HR 85 | Ht 67.5 in | Wt 169.6 lb

## 2023-06-17 DIAGNOSIS — R7303 Prediabetes: Secondary | ICD-10-CM

## 2023-06-17 DIAGNOSIS — M8580 Other specified disorders of bone density and structure, unspecified site: Secondary | ICD-10-CM | POA: Diagnosis not present

## 2023-06-17 DIAGNOSIS — I10 Essential (primary) hypertension: Secondary | ICD-10-CM

## 2023-06-17 DIAGNOSIS — E782 Mixed hyperlipidemia: Secondary | ICD-10-CM | POA: Diagnosis not present

## 2023-06-17 MED ORDER — AMLODIPINE BESYLATE 5 MG PO TABS
7.5000 mg | ORAL_TABLET | Freq: Every day | ORAL | Status: DC
Start: 2023-06-17 — End: 2023-07-15

## 2023-06-17 NOTE — Assessment & Plan Note (Signed)
Lab Results  Component Value Date   HGBA1C 6.3 (H) 11/18/2022   Advised to follow low carb diet for now

## 2023-06-17 NOTE — Patient Instructions (Signed)
Please start taking Amlodipine - 1.5 tablets once daily instead of 1 tablet.  Please continue to take medications as prescribed.  Please continue to follow low salt diet and perform moderate exercise/walking at least 150 mins/week.

## 2023-06-17 NOTE — Assessment & Plan Note (Signed)
On Calcium and Vitamin D supplements Check DEXA scan

## 2023-06-17 NOTE — Assessment & Plan Note (Addendum)
BP Readings from Last 1 Encounters:  06/17/23 (!) 144/78   Uncontrolled with Amlodipine 5 mg QD, increased dose to 7.5 mg QD Counseled for compliance with the medications Advised DASH diet and moderate exercise/walking, at least 150 mins/week

## 2023-06-17 NOTE — Progress Notes (Signed)
Established Patient Office Visit  Subjective:  Patient ID: Tonya Pacheco, female    DOB: 23-Nov-1945  Age: 77 y.o. MRN: [id number]  CC:  Chief Complaint  Patient presents with   Hypertension    Six month follow up     HPI Tonya Pacheco is a 77 y.o. female with past medical history of HTN, osteopenia and HLD who presents for f/u of her chronic medical conditions.  She has been doing well overall.   BP is  elevated. Takes amlodipine 5 mg QD regularly. Patient denies headache, dizziness, chest pain, dyspnea or palpitations.  She has a history of HLD, but had myositis with statin. She has started Repatha, and is tolerating it well.  She thinks that her blood pressure is elevated partially due to Repatha as she read on Internet.  Past Medical History:  Diagnosis Date   Hypertension     Past Surgical History:  Procedure Laterality Date   COLONOSCOPY WITH PROPOFOL N/A 09/15/2020   Procedure: COLONOSCOPY WITH PROPOFOL;  Surgeon: Dolores Frame, MD;  Location: [facility];  Service: Gastroenterology;  Laterality: N/A;  915    Family History  Problem Relation Age of Onset   Arthritis Sister 26       Rheumatoid Arthritis   Heart disease Sister    Lupus Sister    Cancer Maternal Grandmother    Kidney disease Brother 27       Kidney transplant   Breast cancer Neg Hx     Social History   Socioeconomic History   Marital status: Widowed    Spouse name: Not on file   Number of children: Not on file   Years of education: Not on file   Highest education level: Not on file  Occupational History   Not on file  Tobacco Use   Smoking status: Never   Smokeless tobacco: Never  Vaping Use   Vaping status: Never Used  Substance and Sexual Activity   Alcohol use: No   Drug use: No   Sexual activity: Not Currently  Other Topics Concern   Not on file  Social History Narrative   Not on file   Social Determinants of Health   Financial Resource Strain: Low Risk   (07/11/2022)   Overall Financial Resource Strain (CARDIA)    Difficulty of Paying Living Expenses: Not hard at all  Food Insecurity: No Food Insecurity (07/11/2022)   Hunger Vital Sign    Worried About Running Out of Food in the Last Year: Never true    Ran Out of Food in the Last Year: Never true  Transportation Needs: No Transportation Needs (07/11/2022)   PRAPARE - Administrator, Civil Service (Medical): No    Lack of Transportation (Non-Medical): No  Physical Activity: Sufficiently Active (07/11/2022)   Exercise Vital Sign    Days of Exercise per Week: 6 days    Minutes of Exercise per Session: 30 min  Stress: No Stress Concern Present (07/11/2022)   [employer] of Occupational Health - Occupational Stress Questionnaire    Feeling of Stress : Not at all  Social Connections: Moderately Isolated (07/11/2022)   Social Connection and Isolation Panel [NHANES]    Frequency of Communication with Friends and Family: More than three times a week    Frequency of Social Gatherings with Friends and Family: Three times a week    Attends Religious Services: More than 4 times per year    Active Member of Clubs or Organizations: No  Attends Banker Meetings: Never    Marital Status: Widowed  Intimate Partner Violence: Not At Risk (07/11/2022)   Humiliation, Afraid, Rape, and Kick questionnaire    Fear of Current or Ex-Partner: No    Emotionally Abused: No    Physically Abused: No    Sexually Abused: No    Outpatient Medications Prior to Visit  Medication Sig Dispense Refill   aspirin 81 MG tablet Take 81 mg by mouth daily.     calcium carbonate (OS-CAL) 600 MG TABS tablet Take 600 mg by mouth every evening.      Chlorphen-PE-Acetaminophen (NOREL AD) 4-10-325 MG TABS Take 1 tablet by mouth 3 (three) times daily as needed (Nasal congestion). (Patient not taking: Reported on 03/06/2023) 30 tablet 0   Cholecalciferol (D3 2000 PO) Take 2,000 Units by mouth daily.      Multiple Vitamins-Minerals (CENTRUM ADULTS PO) Take 1 tablet by mouth every evening.      REPATHA SURECLICK 140 MG/ML SOAJ ADMINISTER 1 ML UNDER THE SKIN EVERY 14 DAYS 2 mL 11   amLODipine (NORVASC) 5 MG tablet TAKE 1 TABLET(5 MG) BY MOUTH DAILY 90 tablet 0   No facility-administered medications prior to visit.    Allergies  Allergen Reactions   Penicillins Swelling    Swelling mouth/ tongue    Zetia [Ezetimibe] Swelling    Eyes puffy, tongue swelling   Statins Other (See Comments)    Muscle/joint aches    ROS Review of Systems  Constitutional:  Negative for chills and fever.  HENT:  Negative for congestion, sinus pressure, sinus pain and sore throat.   Eyes:  Negative for pain and discharge.  Respiratory:  Negative for cough and shortness of breath.   Cardiovascular:  Negative for chest pain and palpitations.  Gastrointestinal:  Negative for abdominal pain, constipation, diarrhea, nausea and vomiting.  Endocrine: Negative for polydipsia and polyuria.  Genitourinary:  Negative for dysuria and hematuria.  Musculoskeletal:  Negative for neck pain and neck stiffness.  Skin:  Negative for rash.  Neurological:  Negative for dizziness and weakness.  Psychiatric/Behavioral:  Negative for agitation and behavioral problems.       Objective:    Physical Exam Vitals reviewed.  Constitutional:      General: She is not in acute distress.    Appearance: She is not diaphoretic.  HENT:     Head: Normocephalic and atraumatic.     Nose: Nose normal.     Mouth/Throat:     Mouth: Mucous membranes are moist.  Eyes:     General: No scleral icterus.    Extraocular Movements: Extraocular movements intact.  Cardiovascular:     Rate and Rhythm: Normal rate and regular rhythm.     Pulses: Normal pulses.     Heart sounds: Murmur (Systolic, most profound at left upper sternal border) heard.  Pulmonary:     Breath sounds: Normal breath sounds. No wheezing or rales.  Abdominal:      Palpations: Abdomen is soft.     Tenderness: There is no abdominal tenderness.  Musculoskeletal:     Cervical back: Neck supple. No tenderness.     Right lower leg: No edema.     Left lower leg: No edema.  Skin:    General: Skin is warm.     Findings: No rash.  Neurological:     General: No focal deficit present.     Mental Status: She is alert and oriented to person, place, and time.  Sensory: No sensory deficit.     Motor: No weakness.  Psychiatric:        Mood and Affect: Mood normal.        Behavior: Behavior normal.     BP (!) 144/78 (BP Location: Left Arm)   Pulse 85   Ht 5' 7.5" (1.715 m)   Wt 169 lb 9.6 oz (76.9 kg)   SpO2 97%   BMI 26.17 kg/m  Wt Readings from Last 3 Encounters:  06/17/23 169 lb 9.6 oz (76.9 kg)  03/06/23 169 lb 12.8 oz (77 kg)  11/18/22 166 lb 12.8 oz (75.7 kg)    Lab Results  Component Value Date   TSH 2.760 11/18/2022   Lab Results  Component Value Date   WBC 6.8 11/18/2022   HGB 11.8 11/18/2022   HCT 36.5 11/18/2022   MCV 92 11/18/2022   PLT 304 11/18/2022   Lab Results  Component Value Date   NA 140 11/18/2022   K 4.3 11/18/2022   CO2 26 11/18/2022   GLUCOSE 91 11/18/2022   BUN 12 11/18/2022   CREATININE 0.78 11/18/2022   BILITOT 0.4 11/18/2022   ALKPHOS 91 11/18/2022   AST 12 11/18/2022   ALT 10 11/18/2022   PROT 7.0 11/18/2022   ALBUMIN 4.2 11/18/2022   CALCIUM 9.2 11/18/2022   ANIONGAP 6 07/01/2018   EGFR 80 11/18/2022   Lab Results  Component Value Date   CHOL 141 11/18/2022   Lab Results  Component Value Date   HDL 63 11/18/2022   Lab Results  Component Value Date   LDLCALC 66 11/18/2022   Lab Results  Component Value Date   TRIG 56 11/18/2022   Lab Results  Component Value Date   CHOLHDL 2.2 11/18/2022   Lab Results  Component Value Date   HGBA1C 6.3 (H) 11/18/2022      Assessment & Plan:   Problem List Items Addressed This Visit       Cardiovascular and Mediastinum   Essential  hypertension, benign - Primary    BP Readings from Last 1 Encounters:  06/17/23 (!) 144/78   Uncontrolled with Amlodipine 5 mg QD, increased dose to 7.5 mg QD Counseled for compliance with the medications Advised DASH diet and moderate exercise/walking, at least 150 mins/week      Relevant Medications   amLODipine (NORVASC) 5 MG tablet     Musculoskeletal and Integument   Osteopenia    On Calcium and Vitamin D supplements Check DEXA scan      Relevant Orders   DG Bone Density     Other   Hyperlipidemia    Last lipid profile reviewed Did not tolerate statin and Zetia in the past On Repatha now Continue to follow low carb and low cholesterol diet Although she is concerned about her elevated blood pressure due to Repatha, it is less likely to be due to Repatha - she had HTN even before starting Repatha and had well controlled HTN for 1 year after starting Repatha      Relevant Medications   amLODipine (NORVASC) 5 MG tablet   Prediabetes    Lab Results  Component Value Date   HGBA1C 6.3 (H) 11/18/2022   Advised to follow low carb diet for now        Meds ordered this encounter  Medications   amLODipine (NORVASC) 5 MG tablet    Sig: Take 1.5 tablets (7.5 mg total) by mouth daily.    Follow-up: Return in about 4 weeks (around  07/15/2023) for HTN.    Anabel Halon, MD

## 2023-06-17 NOTE — Assessment & Plan Note (Signed)
Last lipid profile reviewed Did not tolerate statin and Zetia in the past On Repatha now Continue to follow low carb and low cholesterol diet Although she is concerned about her elevated blood pressure due to Repatha, it is less likely to be due to Repatha - she had HTN even before starting Repatha and had well controlled HTN for 1 year after starting Repatha

## 2023-07-02 ENCOUNTER — Ambulatory Visit (HOSPITAL_COMMUNITY)
Admission: RE | Admit: 2023-07-02 | Discharge: 2023-07-02 | Disposition: A | Discharge status: Home / Self Care | Payer: Medicare Other | Source: Ambulatory Visit | Attending: Internal Medicine | Admitting: Internal Medicine

## 2023-07-02 DIAGNOSIS — M85852 Other specified disorders of bone density and structure, left thigh: Secondary | ICD-10-CM | POA: Diagnosis not present

## 2023-07-02 DIAGNOSIS — Z13820 Encounter for screening for osteoporosis: Secondary | ICD-10-CM | POA: Insufficient documentation

## 2023-07-02 DIAGNOSIS — M8589 Other specified disorders of bone density and structure, multiple sites: Secondary | ICD-10-CM | POA: Insufficient documentation

## 2023-07-02 DIAGNOSIS — Z780 Asymptomatic menopausal state: Secondary | ICD-10-CM | POA: Diagnosis not present

## 2023-07-02 DIAGNOSIS — M8580 Other specified disorders of bone density and structure, unspecified site: Secondary | ICD-10-CM

## 2023-07-15 ENCOUNTER — Encounter: Payer: Self-pay | Admitting: Internal Medicine

## 2023-07-15 ENCOUNTER — Ambulatory Visit (INDEPENDENT_AMBULATORY_CARE_PROVIDER_SITE_OTHER): Payer: Medicare Other | Admitting: Internal Medicine

## 2023-07-15 VITALS — BP 133/76 | HR 70 | Ht 67.0 in | Wt 173.6 lb

## 2023-07-15 DIAGNOSIS — R7303 Prediabetes: Secondary | ICD-10-CM

## 2023-07-15 DIAGNOSIS — I350 Nonrheumatic aortic (valve) stenosis: Secondary | ICD-10-CM

## 2023-07-15 DIAGNOSIS — E782 Mixed hyperlipidemia: Secondary | ICD-10-CM

## 2023-07-15 DIAGNOSIS — I10 Essential (primary) hypertension: Secondary | ICD-10-CM

## 2023-07-15 LAB — HEMOGLOBIN A1C: Hemoglobin A1C: 6.4

## 2023-07-15 MED ORDER — AMLODIPINE BESYLATE 5 MG PO TABS
7.5000 mg | ORAL_TABLET | Freq: Every day | ORAL | 1 refills | Status: DC
Start: 2023-07-15 — End: 2023-09-04

## 2023-07-15 NOTE — Assessment & Plan Note (Signed)
Last lipid profile reviewed Did not tolerate statin and Zetia in the past On Repatha now Continue to follow low carb and low cholesterol diet Although she is concerned about her elevated blood pressure due to Repatha, it is less likely to be due to Repatha - she had HTN even before starting Repatha and had well controlled HTN for 1 year after starting Repatha

## 2023-07-15 NOTE — Assessment & Plan Note (Signed)
BP Readings from Last 1 Encounters:  07/15/23 133/76   Well-controlled with Amlodipine 7.5 mg QD Counseled for compliance with the medications Advised DASH diet and moderate exercise/walking, at least 150 mins/week

## 2023-07-15 NOTE — Assessment & Plan Note (Addendum)
Echo reviewed (05/24) Currently asymptomatic Repeat Echo in 1 year Followed by Cardiology

## 2023-07-15 NOTE — Patient Instructions (Signed)
Please continue taking Amlodipine 1 and a half tablet once daily.  Please continue to take medications as prescribed.  Please continue to follow low carb diet and perform moderate exercise/walking at least 150 mins/week.

## 2023-07-15 NOTE — Progress Notes (Signed)
Established Patient Office Visit  Subjective:  Patient ID: Tonya Pacheco, female    DOB: 09/11/1946  Age: 77 y.o. MRN: [id number]  CC:  Chief Complaint  Patient presents with   Hypertension    Follow up     HPI Tonya Pacheco is a 77 y.o. female with past medical history of HTN, osteopenia and HLD who presents for f/u of her chronic medical conditions.  She has been doing well overall.   BP is wnl now. Takes amlodipine 7.5 mg QD regularly now. Her home BP readings have been wnl - 120s/70s, as well. Patient denies headache, dizziness, chest pain, dyspnea or palpitations.  She has a history of HLD, but had myositis with statin. She has started Repatha, and is tolerating it well.  She thought that her blood pressure was elevated partially due to Repatha as she read on Internet, but she has seen better BP readings since increasing dose of Amlodipine.  Past Medical History:  Diagnosis Date   Hypertension     Past Surgical History:  Procedure Laterality Date   COLONOSCOPY WITH PROPOFOL N/A 09/15/2020   Procedure: COLONOSCOPY WITH PROPOFOL;  Surgeon: Dolores Frame, MD;  [facility];  Service: Gastroenterology;  Laterality: N/A;  915    Family History  Problem Relation Age of Onset   Arthritis Sister 17       Rheumatoid Arthritis   Heart disease Sister    Lupus Sister    Cancer Maternal Grandmother    Kidney disease Brother 86       Kidney transplant   Breast cancer Neg Hx     Social History   Socioeconomic History   Marital status: Widowed    Spouse name: Not on file   Number of children: Not on file   Years of education: Not on file   Highest education level: Bachelor's degree (e.g., BA, AB, BS)  Occupational History   Not on file  Tobacco Use   Smoking status: Never   Smokeless tobacco: Never  Vaping Use   Vaping status: Never Used  Substance and Sexual Activity   Alcohol use: No   Drug use: No   Sexual activity: Not Currently  Other  Topics Concern   Not on file  Social History Narrative   Not on file   Social Determinants of Health   Financial Resource Strain: Low Risk  (07/14/2023)   Overall Financial Resource Strain (CARDIA)    Difficulty of Paying Living Expenses: Not hard at all  Food Insecurity: No Food Insecurity (07/14/2023)   Hunger Vital Sign    Worried About Running Out of Food in the Last Year: Never true    Ran Out of Food in the Last Year: Never true  Transportation Needs: No Transportation Needs (07/14/2023)   PRAPARE - Administrator, Civil Service (Medical): No    Lack of Transportation (Non-Medical): No  Physical Activity: Sufficiently Active (07/14/2023)   Exercise Vital Sign    Days of Exercise per Week: 5 days    Minutes of Exercise per Session: 50 min  Stress: No Stress Concern Present (07/14/2023)   [employer] of Occupational Health - Occupational Stress Questionnaire    Feeling of Stress : Not at all  Social Connections: Moderately Integrated (07/14/2023)   Social Connection and Isolation Panel [NHANES]    Frequency of Communication with Friends and Family: More than three times a week    Frequency of Social Gatherings with Friends and Family: More than  three times a week    Attends Religious Services: More than 4 times per year    Active Member of Clubs or Organizations: Yes    Attends Banker Meetings: More than 4 times per year    Marital Status: Widowed  Intimate Partner Violence: Not At Risk (07/11/2022)   Humiliation, Afraid, Rape, and Kick questionnaire    Fear of Current or Ex-Partner: No    Emotionally Abused: No    Physically Abused: No    Sexually Abused: No    Outpatient Medications Prior to Visit  Medication Sig Dispense Refill   aspirin 81 MG tablet Take 81 mg by mouth daily.     calcium carbonate (OS-CAL) 600 MG TABS tablet Take 600 mg by mouth every evening.      Chlorphen-PE-Acetaminophen (NOREL AD) 4-10-325 MG TABS Take 1 tablet by  mouth 3 (three) times daily as needed (Nasal congestion). (Patient not taking: Reported on 03/06/2023) 30 tablet 0   Cholecalciferol (D3 2000 PO) Take 2,000 Units by mouth daily.     Multiple Vitamins-Minerals (CENTRUM ADULTS PO) Take 1 tablet by mouth every evening.      REPATHA SURECLICK 140 MG/ML SOAJ ADMINISTER 1 ML UNDER THE SKIN EVERY 14 DAYS 2 mL 11   amLODipine (NORVASC) 5 MG tablet Take 1.5 tablets (7.5 mg total) by mouth daily.     No facility-administered medications prior to visit.    Allergies  Allergen Reactions   Penicillins Swelling    Swelling mouth/ tongue    Zetia [Ezetimibe] Swelling    Eyes puffy, tongue swelling   Statins Other (See Comments)    Muscle/joint aches    ROS Review of Systems  Constitutional:  Negative for chills and fever.  HENT:  Negative for congestion, sinus pressure, sinus pain and sore throat.   Eyes:  Negative for pain and discharge.  Respiratory:  Negative for cough and shortness of breath.   Cardiovascular:  Negative for chest pain and palpitations.  Gastrointestinal:  Negative for abdominal pain, constipation, diarrhea, nausea and vomiting.  Endocrine: Negative for polydipsia and polyuria.  Genitourinary:  Negative for dysuria and hematuria.  Musculoskeletal:  Negative for neck pain and neck stiffness.  Skin:  Negative for rash.  Neurological:  Negative for dizziness and weakness.  Psychiatric/Behavioral:  Negative for agitation and behavioral problems.       Objective:    Physical Exam Vitals reviewed.  Constitutional:      General: She is not in acute distress.    Appearance: She is not diaphoretic.  HENT:     Head: Normocephalic and atraumatic.     Nose: Nose normal.     Mouth/Throat:     Mouth: Mucous membranes are moist.  Eyes:     General: No scleral icterus.    Extraocular Movements: Extraocular movements intact.  Cardiovascular:     Rate and Rhythm: Normal rate and regular rhythm.     Pulses: Normal pulses.      Heart sounds: Murmur (Systolic, most profound at left upper sternal border) heard.  Pulmonary:     Breath sounds: Normal breath sounds. No wheezing or rales.  Musculoskeletal:     Cervical back: Neck supple. No tenderness.     Right lower leg: No edema.     Left lower leg: No edema.  Skin:    General: Skin is warm.     Findings: No rash.  Neurological:     General: No focal deficit present.     Mental  Status: She is alert and oriented to person, place, and time.     Sensory: No sensory deficit.     Motor: No weakness.  Psychiatric:        Mood and Affect: Mood normal.        Behavior: Behavior normal.     BP 133/76 (BP Location: Right Arm, Patient Position: Sitting, Cuff Size: Normal)   Pulse 70   Ht 5\' 7"  (1.702 m)   Wt 173 lb 9.6 oz (78.7 kg)   SpO2 97%   BMI 27.19 kg/m  Wt Readings from Last 3 Encounters:  07/15/23 173 lb 9.6 oz (78.7 kg)  06/17/23 169 lb 9.6 oz (76.9 kg)  03/06/23 169 lb 12.8 oz (77 kg)    Lab Results  Component Value Date   TSH 2.760 11/18/2022   Lab Results  Component Value Date   WBC 6.8 11/18/2022   HGB 11.8 11/18/2022   HCT 36.5 11/18/2022   MCV 92 11/18/2022   PLT 304 11/18/2022   Lab Results  Component Value Date   NA 140 11/18/2022   K 4.3 11/18/2022   CO2 26 11/18/2022   GLUCOSE 91 11/18/2022   BUN 12 11/18/2022   CREATININE 0.78 11/18/2022   BILITOT 0.4 11/18/2022   ALKPHOS 91 11/18/2022   AST 12 11/18/2022   ALT 10 11/18/2022   PROT 7.0 11/18/2022   ALBUMIN 4.2 11/18/2022   CALCIUM 9.2 11/18/2022   ANIONGAP 6 07/01/2018   EGFR 80 11/18/2022   Lab Results  Component Value Date   CHOL 141 11/18/2022   Lab Results  Component Value Date   HDL 63 11/18/2022   Lab Results  Component Value Date   LDLCALC 66 11/18/2022   Lab Results  Component Value Date   TRIG 56 11/18/2022   Lab Results  Component Value Date   CHOLHDL 2.2 11/18/2022   Lab Results  Component Value Date   HGBA1C 6.3 (H) 11/18/2022       Assessment & Plan:   Problem List Items Addressed This Visit       Cardiovascular and Mediastinum   Essential hypertension, benign - Primary    BP Readings from Last 1 Encounters:  07/15/23 133/76   Well-controlled with Amlodipine 7.5 mg QD Counseled for compliance with the medications Advised DASH diet and moderate exercise/walking, at least 150 mins/week      Relevant Medications   amLODipine (NORVASC) 5 MG tablet   Nonrheumatic aortic valve stenosis    Echo reviewed (05/24) Currently asymptomatic Repeat Echo in 1 year Followed by Cardiology      Relevant Medications   amLODipine (NORVASC) 5 MG tablet     Other   Hyperlipidemia    Last lipid profile reviewed Did not tolerate statin and Zetia in the past On Repatha now Continue to follow low carb and low cholesterol diet Although she is concerned about her elevated blood pressure due to Repatha, it is less likely to be due to Repatha - she had HTN even before starting Repatha and had well controlled HTN for 1 year after starting Repatha      Relevant Medications   amLODipine (NORVASC) 5 MG tablet   Prediabetes    Lab Results  Component Value Date   HGBA1C 6.3 (H) 11/18/2022   Advised to follow low carb diet for now Check HbA1c      Relevant Orders   Bayer DCA Hb A1c Waived      Meds ordered this encounter  Medications  amLODipine (NORVASC) 5 MG tablet    Sig: Take 1.5 tablets (7.5 mg total) by mouth daily.    Dispense:  135 tablet    Refill:  1    Follow-up: Return in about 5 months (around 12/15/2023) for Annual physical.    Anabel Halon, MD

## 2023-07-15 NOTE — Assessment & Plan Note (Addendum)
Lab Results  Component Value Date   HGBA1C 6.3 (H) 11/18/2022   Advised to follow low carb diet for now Check HbA1c

## 2023-07-17 LAB — BAYER DCA HB A1C WAIVED: HB A1C (BAYER DCA - WAIVED): 6.4 % — ABNORMAL HIGH (ref 4.8–5.6)

## 2023-08-27 ENCOUNTER — Ambulatory Visit: Payer: Medicare Other | Admitting: Emergency Medicine

## 2023-08-27 VITALS — Ht 67.0 in | Wt 164.0 lb

## 2023-08-27 DIAGNOSIS — Z1231 Encounter for screening mammogram for malignant neoplasm of breast: Secondary | ICD-10-CM

## 2023-08-27 DIAGNOSIS — Z0000 Encounter for general adult medical examination without abnormal findings: Secondary | ICD-10-CM | POA: Diagnosis not present

## 2023-08-27 NOTE — Progress Notes (Signed)
Subjective:   Tonya Pacheco is a 77 y.o. female who presents for Medicare Annual (Subsequent) preventive examination.  Visit Complete: Virtual I connected with  Tonya Pacheco on 08/27/23 by a audio enabled telemedicine application and verified that I am speaking with the correct person using two identifiers.  Patient Location: Other:  Work  [employer] administrator  I discussed the limitations of evaluation and management by telemedicine. The patient expressed understanding and agreed to proceed.  Vital Signs: Because this visit was a virtual/telehealth visit, some criteria may be missing or patient reported. Any vitals not documented were not able to be obtained and vitals that have been documented are patient reported.  Cardiac Risk Factors include: advanced age (>82men, >85 women);hypertension;dyslipidemia;Other (see comment), Risk factor comments: pre-diabetes     Objective:    Today's Vitals   08/27/23 1250  Weight: 164 lb (74.4 kg)  Height: 5\' 7"  (1.702 m)   Body mass index is 25.69 kg/m.     08/27/2023    1:00 PM 07/11/2022    4:21 PM 06/26/2021    3:13 PM 09/15/2020    8:15 AM 03/29/2020    8:10 AM 01/05/2019    9:55 AM 07/09/2018   12:07 AM  Advanced Directives  Does Patient Have a Medical Advance Directive? Yes Yes Yes Yes No No No  Type of Special educational needs teacher of [place];Living will Healthcare Power of [place];Living will Living will     Does patient want to make changes to medical advance directive? Yes (MAU/Ambulatory/Procedural Areas - Information given) No - Patient declined       Copy of Healthcare Power of Attorney in Chart?  No - copy requested       Would patient like information on creating a medical advance directive?     Yes (MAU/Ambulatory/Procedural Areas - Information given) Yes (MAU/Ambulatory/Procedural Areas - Information given)     Current Medications (verified) Outpatient Encounter Medications as of 08/27/2023  Medication Sig    amLODipine (NORVASC) 5 MG tablet Take 1.5 tablets (7.5 mg total) by mouth daily.   aspirin 81 MG tablet Take 81 mg by mouth daily.   calcium carbonate (OS-CAL) 600 MG TABS tablet Take 600 mg by mouth every evening.    Cholecalciferol (D3 2000 PO) Take 2,000 Units by mouth daily.   Multiple Vitamins-Minerals (CENTRUM ADULTS PO) Take 1 tablet by mouth every evening.    REPATHA SURECLICK 140 MG/ML SOAJ ADMINISTER 1 ML UNDER THE SKIN EVERY 14 DAYS   Chlorphen-PE-Acetaminophen (NOREL AD) 4-10-325 MG TABS Take 1 tablet by mouth 3 (three) times daily as needed (Nasal congestion). (Patient not taking: Reported on 03/06/2023)   No facility-administered encounter medications on file as of 08/27/2023.    Allergies (verified) Penicillins, Zetia [ezetimibe], and Statins   History: Past Medical History:  Diagnosis Date   Hypertension    Past Surgical History:  Procedure Laterality Date   COLONOSCOPY WITH PROPOFOL N/A 09/15/2020   Procedure: COLONOSCOPY WITH PROPOFOL;  Surgeon: Dolores Frame, MD;  [facility];  Service: Gastroenterology;  Laterality: N/A;  915   Family History  Problem Relation Age of Onset   Hyperlipidemia Mother    Hyperlipidemia Father    Arthritis Sister 52       Rheumatoid Arthritis   Heart disease Sister    Lupus Sister    Kidney disease Brother 63       Kidney transplant   Cancer Maternal Grandmother    Breast cancer Neg Hx  Social History   Socioeconomic History   Marital status: Widowed    Spouse name: Not on file   Number of children: 4   Years of education: Not on file   Highest education level: Bachelor's degree (e.g., BA, AB, BS)  Occupational History   Not on file  Tobacco Use   Smoking status: Never   Smokeless tobacco: Never  Vaping Use   Vaping status: Never Used  Substance and Sexual Activity   Alcohol use: No   Drug use: No   Sexual activity: Not Currently  Other Topics Concern   Not on file  Social History  Narrative   Working Scientific laboratory technician   Social Determinants of Health   Financial Resource Strain: Low Risk  (08/27/2023)   Overall Financial Resource Strain (CARDIA)    Difficulty of Paying Living Expenses: Not hard at all  Food Insecurity: No Food Insecurity (08/27/2023)   Hunger Vital Sign    Worried About Running Out of Food in the Last Year: Never true    Ran Out of Food in the Last Year: Never true  Transportation Needs: No Transportation Needs (08/27/2023)   PRAPARE - Administrator, Civil Service (Medical): No    Lack of Transportation (Non-Medical): No  Physical Activity: Sufficiently Active (08/27/2023)   Exercise Vital Sign    Days of Exercise per Week: 6 days    Minutes of Exercise per Session: 30 min  Stress: No Stress Concern Present (08/27/2023)   [employer] of Occupational Health - Occupational Stress Questionnaire    Feeling of Stress : Not at all  Social Connections: Moderately Isolated (08/27/2023)   Social Connection and Isolation Panel [NHANES]    Frequency of Communication with Friends and Family: More than three times a week    Frequency of Social Gatherings with Friends and Family: More than three times a week    Attends Religious Services: More than 4 times per year    Active Member of [employer] or Organizations: No    Attends Banker Meetings: Never    Marital Status: Widowed    Tobacco Counseling Counseling given: Not Answered   Clinical Intake:  Pre-visit preparation completed: Yes  Pain : No/denies pain     BMI - recorded: 25.69 Nutritional Status: BMI 25 -29 Overweight Nutritional Risks: None Diabetes: No  How often do you need to have someone help you when you read instructions, pamphlets, or other written materials from your doctor or pharmacy?: 1 - Never  Interpreter Needed?: No  Information entered by :: Tora Kindred, CMA   Activities of Daily Living    08/27/2023   12:52 PM  In your present state  of health, do you have any difficulty performing the following activities:  Hearing? 0  Vision? 0  Difficulty concentrating or making decisions? 0  Walking or climbing stairs? 0  Dressing or bathing? 0  Doing errands, shopping? 0  Preparing Food and eating ? N  Using the Toilet? N  In the past six months, have you accidently leaked urine? N  Do you have problems with loss of bowel control? N  Managing your Medications? N  Managing your Finances? N  Housekeeping or managing your Housekeeping? N    Patient Care Team: Anabel Halon, MD as PCP - General (Internal Medicine)  Indicate any recent Medical Services you may have received from other than [facility] providers in the past year (date may be approximate).     Assessment:   This  is a routine wellness examination for [place].  Hearing/Vision screen Hearing Screening - Comments:: Denies hearing loss Vision Screening - Comments:: Does not get eye exams, recommend getting an eye exam   Goals Addressed             This Visit's Progress    Patient Stated       Drink 8 glasses of water before 6pm      Depression Screen    08/27/2023   12:57 PM 07/15/2023    4:17 PM 06/17/2023    4:14 PM 11/18/2022    3:23 PM 07/11/2022    4:22 PM 07/11/2022    4:20 PM 04/09/2022    1:35 PM  PHQ 2/9 Scores  PHQ - 2 Score 0 0 0 0 0 0 0  PHQ- 9 Score 0          Fall Risk    08/27/2023    1:04 PM 07/15/2023    4:17 PM 06/17/2023    4:14 PM 11/18/2022    3:23 PM 07/11/2022    4:21 PM  Fall Risk   Falls in the past year? 0 0 0 0 0  Number falls in past yr: 0 0 0 0 0  Injury with Fall? 0 0 0 0 0  Risk for fall due to : No Fall Risks    No Fall Risks  Follow up Falls prevention discussed    Falls evaluation completed    MEDICARE RISK AT HOME: Medicare Risk at Home Any stairs in or around the home?: No If so, are there any without handrails?: No Home free of loose throw rugs in walkways, pet beds, electrical cords, etc?: Yes Adequate  lighting in your home to reduce risk of falls?: Yes Life alert?: No Use of a cane, walker or w/c?: No Grab bars in the bathroom?: No Shower chair or bench in shower?: No Elevated toilet seat or a handicapped toilet?: Yes  TIMED UP AND GO:  Was the test performed?  No    Cognitive Function:        08/27/2023    1:05 PM 07/11/2022    4:22 PM 06/26/2021    3:14 PM  6CIT Screen  What Year? 0 points 0 points 0 points  What month? 0 points 0 points 0 points  What time? 0 points 0 points 0 points  Count back from 20 0 points 0 points 0 points  Months in reverse 0 points 0 points 2 points  Repeat phrase 0 points 0 points 0 points  Total Score 0 points 0 points 2 points    Immunizations Immunization History  Administered Date(s) Administered   Fluad Quad(high Dose 65+) 09/03/2021   Moderna Covid-19 Vaccine Bivalent Booster 35yrs & up 11/07/2021   Moderna SARS-COV2 Booster Vaccination 10/02/2020   Moderna Sars-Covid-2 Vaccination 01/06/2020, 02/05/2020   Pneumococcal Conjugate-13 03/06/2015   Pneumococcal Polysaccharide-23 07/30/2016    TDAP status: Due, Education has been provided regarding the importance of this vaccine. Advised may receive this vaccine at local pharmacy or Health Dept. Aware to provide a copy of the vaccination record if obtained from local pharmacy or Health Dept. Verbalized acceptance and understanding.  Flu Vaccine status: Declined, Education has been provided regarding the importance of this vaccine but patient still declined. Advised may receive this vaccine at local pharmacy or Health Dept. Aware to provide a copy of the vaccination record if obtained from local pharmacy or Health Dept. Verbalized acceptance and understanding.  Pneumococcal vaccine status: Up to  date  Covid-19 vaccine status: Declined, Education has been provided regarding the importance of this vaccine but patient still declined. Advised may receive this vaccine at local pharmacy or Health  Dept.or vaccine clinic. Aware to provide a copy of the vaccination record if obtained from local pharmacy or Health Dept. Verbalized acceptance and understanding.  Qualifies for Shingles Vaccine? Yes   Zostavax completed No   Shingrix Completed?: No.    Education has been provided regarding the importance of this vaccine. Patient has been advised to call insurance company to determine out of pocket expense if they have not yet received this vaccine. Advised may also receive vaccine at local pharmacy or Health Dept. Verbalized acceptance and understanding. Patient Declined  Screening Tests Health Maintenance  Topic Date Due   DTaP/Tdap/Td (1 - Tdap) Never done   Zoster Vaccines- Shingrix (1 of 2) Never done   INFLUENZA VACCINE  05/29/2023   COVID-19 Vaccine (5 - 2023-24 season) 06/29/2023   MAMMOGRAM  10/01/2023   Medicare Annual Wellness (AWV)  08/26/2024   DEXA SCAN  07/01/2025   Pneumonia Vaccine 55+ Years old  Completed   Hepatitis C Screening  Completed   HPV VACCINES  Aged Out   Colonoscopy  Discontinued    Health Maintenance  Health Maintenance Due  Topic Date Due   DTaP/Tdap/Td (1 - Tdap) Never done   Zoster Vaccines- Shingrix (1 of 2) Never done   INFLUENZA VACCINE  05/29/2023   COVID-19 Vaccine (5 - 2023-24 season) 06/29/2023    Colorectal cancer screening: No longer required.   Mammogram status: Completed 09/30/22. Repeat every year  Bone Density status: Completed 07/02/23. Results reflect: Bone density results: OSTEOPENIA. Repeat every 2 years.  Lung Cancer Screening: (Low Dose CT Chest recommended if Age 26-80 years, 20 pack-year currently smoking OR have quit w/in 15years.) does not qualify.   Lung Cancer Screening Referral: n/a  Additional Screening:  Hepatitis C Screening: does not qualify; Completed 12/29/17  Vision Screening: Recommended annual ophthalmology exams for early detection of glaucoma and other disorders of the eye.   Dental Screening:  Recommended annual dental exams for proper oral hygiene   Community Resource Referral / Chronic Care Management: CRR required this visit?  No   CCM required this visit?  No     Plan:     I have personally reviewed and noted the following in the patient's chart:   Medical and social history Use of alcohol, tobacco or illicit drugs  Current medications and supplements including opioid prescriptions. Patient is not currently taking opioid prescriptions. Functional ability and status Nutritional status Physical activity Advanced directives List of other physicians Hospitalizations, surgeries, and ER visits in previous 12 months Vitals Screenings to include cognitive, depression, and falls Referrals and appointments  In addition, I have reviewed and discussed with patient certain preventive protocols, quality metrics, and best practice recommendations. A written personalized care plan for preventive services as well as general preventive health recommendations were provided to patient.     Tora Kindred, CMA   08/27/2023   After Visit Summary: (MyChart) Due to this being a telephonic visit, the after visit summary with patients personalized plan was offered to patient via MyChart   Nurse Notes:  Placed an order for a MMG due in December. Needs Tdap Needs routine eye exam Declined flu, shingles and covid vaccines

## 2023-08-27 NOTE — Patient Instructions (Addendum)
[name] , Thank you for taking time to come for your Medicare Wellness Visit. I appreciate your ongoing commitment to your health goals. Please review the following plan we discussed and let me know if I can assist you in the future.   Referrals/Orders/Follow-Ups/Clinician Recommendations: Get a routine eye exam at your earliest convenience. Get a tetanus vaccine at your earliest convenience. I have placed an order for a mammogram that is due after 09/30/24. Please call scheduling at [phone] to schedule.  This is a list of the screening recommended for you and due dates:  Health Maintenance  Topic Date Due   DTaP/Tdap/Td vaccine (1 - Tdap) Never done   Zoster (Shingles) Vaccine (1 of 2) Never done   COVID-19 Vaccine (5 - 2023-24 season) 06/29/2023   Flu Shot  01/26/2024*   Mammogram  10/01/2023   Medicare Annual Wellness Visit  08/26/2024   DEXA scan (bone density measurement)  07/01/2025   Pneumonia Vaccine  Completed   Hepatitis C Screening  Completed   HPV Vaccine  Aged Out   Colon Cancer Screening  Discontinued  *Topic was postponed. The date shown is not the original due date.    Advanced directives: (ACP Link)Information on Advanced Care Planning can be found at [facility] [place] Advance Health Care Directives Advance Health Care Directives ([email or web address]   Once you have completed the forms, please bring a copy of your health care power of attorney and living will to the office to be added to your chart at your convenience.   Next Medicare Annual Wellness Visit scheduled for next year: Yes, 08/30/24 @ 11:20am

## 2023-09-04 ENCOUNTER — Other Ambulatory Visit: Payer: Self-pay | Admitting: Internal Medicine

## 2023-09-04 DIAGNOSIS — I10 Essential (primary) hypertension: Secondary | ICD-10-CM

## 2023-10-11 ENCOUNTER — Other Ambulatory Visit: Payer: Self-pay | Admitting: Internal Medicine

## 2023-10-11 DIAGNOSIS — E7800 Pure hypercholesterolemia, unspecified: Secondary | ICD-10-CM

## 2023-10-30 ENCOUNTER — Other Ambulatory Visit (HOSPITAL_COMMUNITY): Payer: Medicare Other

## 2023-10-31 ENCOUNTER — Ambulatory Visit (HOSPITAL_COMMUNITY)
Admission: RE | Admit: 2023-10-31 | Discharge: 2023-10-31 | Disposition: A | Discharge status: Home / Self Care | Payer: Medicare Other | Source: Ambulatory Visit | Attending: Cardiology | Admitting: Cardiology

## 2023-10-31 DIAGNOSIS — I350 Nonrheumatic aortic (valve) stenosis: Secondary | ICD-10-CM | POA: Diagnosis not present

## 2023-10-31 LAB — ECHOCARDIOGRAM COMPLETE
AR max vel: 1.23 cm2
AV Area VTI: 1.29 cm2
AV Area mean vel: 1.22 cm2
AV Mean grad: 15.5 mm[Hg]
AV Peak grad: 28.3 mm[Hg]
Ao pk vel: 2.66 m/s
Area-P 1/2: 4.04 cm2
Calc EF: 61.8 %
MV VTI: 3.65 cm2
P 1/2 time: 579 ms
S' Lateral: 2.7 cm
Single Plane A2C EF: 62.1 %
Single Plane A4C EF: 58.2 %

## 2023-10-31 NOTE — Progress Notes (Signed)
  Echocardiogram 2D Echocardiogram has been performed.  Ocie Doyne RDCS 10/31/2023, 3:39 PM

## 2023-11-03 ENCOUNTER — Telehealth: Payer: Self-pay | Admitting: *Deleted

## 2023-11-03 DIAGNOSIS — I350 Nonrheumatic aortic (valve) stenosis: Secondary | ICD-10-CM

## 2023-11-03 DIAGNOSIS — I359 Nonrheumatic aortic valve disorder, unspecified: Secondary | ICD-10-CM

## 2023-11-03 NOTE — Telephone Encounter (Signed)
 Called patient and results given and order made per Dr. Bjorn Pippin for  f/u ECHO in one year. Patient verbalized an understanding.

## 2023-11-17 ENCOUNTER — Other Ambulatory Visit: Payer: Self-pay | Admitting: Internal Medicine

## 2023-11-17 DIAGNOSIS — E7800 Pure hypercholesterolemia, unspecified: Secondary | ICD-10-CM

## 2023-11-17 NOTE — Telephone Encounter (Signed)
Copied from CRM [id number]. Topic: Clinical - Medication Refill >> Nov 17, 2023  4:54 PM Rona Ravens wrote: Most Recent Primary Care Visit:  Provider: Tora Kindred  Department: [facility] CARE  Visit Type: MEDICARE AWV, SEQUENTIAL  Date: 08/27/2023  Medication: REPATHA SURECLICK 140 MG/ML SOAJ  Has the patient contacted their pharmacy? Yes (Agent: If no, request that the patient contact the pharmacy for the refill. If patient does not wish to contact the pharmacy document the reason why and proceed with request.) (Agent: If yes, when and what did the pharmacy advise?)  Is this the correct pharmacy for this prescription? Yes If no, delete pharmacy and type the correct one.  This is the patient's preferred pharmacy:  [facility] [phone] - [place], [place] - [address] AT [facility] [address] [place] [address] Phone: [phone] Fax: [phone]   Has the prescription been filled recently? Yes  Is the patient out of the medication? Yes  Has the patient been seen for an appointment in the last year OR does the patient have an upcoming appointment? Yes  Can we respond through MyChart? Yes  Agent: Please be advised that Rx refills may take up to 3 business days. We ask that you follow-up with your pharmacy.

## 2023-11-18 MED ORDER — REPATHA SURECLICK 140 MG/ML ~~LOC~~ SOAJ: 140.0000 mg | SUBCUTANEOUS | 11 refills | Status: DC

## 2023-11-19 ENCOUNTER — Encounter: Payer: Self-pay | Admitting: Internal Medicine

## 2023-11-19 DIAGNOSIS — K080 Exfoliation of teeth due to systemic causes: Secondary | ICD-10-CM | POA: Diagnosis not present

## 2023-11-20 ENCOUNTER — Telehealth: Payer: Self-pay | Admitting: Internal Medicine

## 2023-11-20 ENCOUNTER — Telehealth: Payer: Self-pay | Admitting: Cardiology

## 2023-11-20 NOTE — Telephone Encounter (Signed)
Spoke with patient, she was agreeable to being enrolled in the [employer].   Card No. [id number] Card Status Active BIN [id number] PCN [id number] PC Group [id number]

## 2023-11-20 NOTE — Telephone Encounter (Signed)
Please see mychart message, per Tonya Pacheco pt should request refill from cardiology as well as a echocardiogram.

## 2023-11-20 NOTE — Telephone Encounter (Signed)
Copied from CRM [id number]. Topic: Clinical - Prescription Issue >> Nov 20, 2023  9:38 AM Dennison Nancy wrote: Reason for CRM: Patient stated there is an issue with the prescription cost  of the Repatha , Pharmacy call let patient know her prescription is ready and the amount they stated the cost will be $420 patient stated that the cost suppose to be around $34-$37  Patient can not afford to pay $420 there are notes in chart review regarding the Repatha

## 2023-11-20 NOTE — Telephone Encounter (Signed)
Pt c/o medication issue:  1. Name of Medication:   Evolocumab (REPATHA SURECLICK) 140 MG/ML SOAJ   2. How are you currently taking this medication (dosage and times per day)? As prescribed  3. Are you having a reaction (difficulty breathing--STAT)?  No  4. What is your medication issue?   Patient stated she wants to re-apply for the program providing this medication at a reduced cost. Patient noted she is completely out of this medication.

## 2023-11-21 ENCOUNTER — Encounter: Payer: Self-pay | Admitting: Pharmacist

## 2023-11-26 ENCOUNTER — Other Ambulatory Visit (HOSPITAL_COMMUNITY): Payer: Self-pay

## 2023-11-26 ENCOUNTER — Ambulatory Visit: Payer: Medicare Other | Attending: Cardiology | Admitting: Cardiology

## 2023-11-26 ENCOUNTER — Other Ambulatory Visit (HOSPITAL_BASED_OUTPATIENT_CLINIC_OR_DEPARTMENT_OTHER): Payer: Self-pay

## 2023-11-26 VITALS — BP 120/68 | HR 82 | Ht 67.0 in | Wt 172.0 lb

## 2023-11-26 DIAGNOSIS — E785 Hyperlipidemia, unspecified: Secondary | ICD-10-CM

## 2023-11-26 DIAGNOSIS — I350 Nonrheumatic aortic (valve) stenosis: Secondary | ICD-10-CM

## 2023-11-26 DIAGNOSIS — I10 Essential (primary) hypertension: Secondary | ICD-10-CM

## 2023-11-26 MED ORDER — AMLODIPINE BESYLATE 5 MG PO TABS
7.5000 mg | ORAL_TABLET | Freq: Every day | ORAL | 0 refills | Status: DC
  Filled 2023-11-26: qty 90, 60d supply, fill #0

## 2023-11-26 NOTE — Progress Notes (Signed)
Cardiology Office Note:    Date:  11/26/2023   ID:  Tonya Pacheco, DOB January 21, 1946, MRN [id number]  PCP:  Anabel Halon, MD  Cardiologist:  None  Electrophysiologist:  None   Referring MD: Anabel Halon, MD   Chief Complaint  Patient presents with   Aortic Stenosis    History of Present Illness:    Tonya Pacheco is a 78 y.o. female with a hx of aortic stenosis, hypertension, hyperlipidemia who presents for follow-up.  She was referred by Dr. Allena Katz for evaluation of heart murmur, initially seen on 11/02/2021.    Echocardiogram 11/26/2021 showed normal biventricular function, grade 1 diastolic dysfunction, moderate left atrial enlargement, mild MR, moderate AS.  Calcium score 12/06/2021 with 210 (83rd percentile).  Echocardiogram 10/2022 showed EF 60 to 65%, normal RV function, moderate to severe aortic stenosis (V-max 2.7 m/s, mean gradient 18 mmHg, AVA 1.0 cm, DI 0.33).  Echocardiogram 10/2023 showed EF 55 to 60%, normal RV function, moderate aortic stenosis (V-max 2.7 m/s, mean gradient 16 mmHg, AVA 1.2 cm, DI 0.41).  Since last clinic visit, she reports she is doing well.  Denies any chest pain, dyspnea, lightheadedness, syncope, or palpitations.  Does report occasional lower extremity edema.  She exercises daily for 30 minutes.    BP Readings from Last 3 Encounters:  11/26/23 120/68  07/15/23 133/76  06/17/23 (!) 144/78      Past Medical History:  Diagnosis Date   Hypertension     Past Surgical History:  Procedure Laterality Date   COLONOSCOPY WITH PROPOFOL N/A 09/15/2020   Procedure: COLONOSCOPY WITH PROPOFOL;  Surgeon: Dolores Frame, MD;  [facility];  Service: Gastroenterology;  Laterality: N/A;  915    Current Medications: Current Meds  Medication Sig   amLODipine (NORVASC) 5 MG tablet Take 1.5 tablets (7.5 mg total) by mouth daily.   aspirin 81 MG tablet Take 81 mg by mouth daily.   calcium carbonate (OS-CAL) 600 MG TABS tablet Take 600 mg  by mouth every evening.    Cholecalciferol (D3 2000 PO) Take 2,000 Units by mouth daily.   Evolocumab (REPATHA SURECLICK) 140 MG/ML SOAJ Inject 140 mg into the skin every 14 (fourteen) days.   Multiple Vitamins-Minerals (CENTRUM ADULTS PO) Take 1 tablet by mouth every evening.    [DISCONTINUED] amLODipine (NORVASC) 5 MG tablet TAKE 1 TABLET(5 MG) BY MOUTH DAILY     Allergies:   Penicillins, Zetia [ezetimibe], and Statins   Social History   Socioeconomic History   Marital status: Widowed    Spouse name: Not on file   Number of children: 4   Years of education: Not on file   Highest education level: Bachelor's degree (e.g., BA, AB, BS)  Occupational History   Not on file  Tobacco Use   Smoking status: Never   Smokeless tobacco: Never  Vaping Use   Vaping status: Never Used  Substance and Sexual Activity   Alcohol use: No   Drug use: No   Sexual activity: Not Currently  Other Topics Concern   Not on file  Social History Narrative   Working Scientific laboratory technician   Social Drivers of Health   Financial Resource Strain: Low Risk  (08/27/2023)   Overall Financial Resource Strain (CARDIA)    Difficulty of Paying Living Expenses: Not hard at all  Food Insecurity: No Food Insecurity (08/27/2023)   Hunger Vital Sign    Worried About Running Out of Food in the Last Year: Never true  Ran Out of Food in the Last Year: Never true  Transportation Needs: No Transportation Needs (08/27/2023)   PRAPARE - Administrator, Civil Service (Medical): No    Lack of Transportation (Non-Medical): No  Physical Activity: Sufficiently Active (08/27/2023)   Exercise Vital Sign    Days of Exercise per Week: 6 days    Minutes of Exercise per Session: 30 min  Stress: No Stress Concern Present (08/27/2023)   [employer] of Occupational Health - Occupational Stress Questionnaire    Feeling of Stress : Not at all  Social Connections: Moderately Isolated (08/27/2023)   Social Connection  and Isolation Panel [NHANES]    Frequency of Communication with Friends and Family: More than three times a week    Frequency of Social Gatherings with Friends and Family: More than three times a week    Attends Religious Services: More than 4 times per year    Active Member of [employer] or Organizations: No    Attends Banker Meetings: Never    Marital Status: Widowed     Family History: The patient's family history includes Arthritis (age of onset: 36) in her sister; Cancer in her maternal grandmother; Heart disease in her sister; Hyperlipidemia in her father and mother; Kidney disease (age of onset: 11) in her brother; Lupus in her sister. There is no history of Breast cancer.  ROS:   Please see the history of present illness.     All other systems reviewed and are negative.  EKGs/Labs/Other Studies Reviewed:    The following studies were reviewed today:   EKG:   03/06/2023: Normal sinus rhythm, rate 81, no ST abnormality 11/26/2023: Normal sinus rhythm, rate 82, no ST abnormality  Recent Labs: No results found for requested labs within last 365 days.  Recent Lipid Panel    Component Value Date/Time   CHOL 141 11/18/2022 1611   TRIG 56 11/18/2022 1611   HDL 63 11/18/2022 1611   CHOLHDL 2.2 11/18/2022 1611   CHOLHDL 4.1 03/29/2020 0832   VLDL 18 07/30/2016 1007   LDLCALC 66 11/18/2022 1611   LDLCALC 201 (H) 03/29/2020 0832    Physical Exam:    VS:  BP 120/68   Pulse 82   Ht 5\' 7"  (1.702 m)   Wt 172 lb (78 kg)   SpO2 97%   BMI 26.94 kg/m     Wt Readings from Last 3 Encounters:  11/26/23 172 lb (78 kg)  08/27/23 164 lb (74.4 kg)  07/15/23 173 lb 9.6 oz (78.7 kg)     GEN:  Well nourished, well developed in no acute distress HEENT: Normal NECK: No JVD; No carotid bruits CARDIAC: RRR, 2 out of 6 systolic murmur RESPIRATORY:  Clear to auscultation without rales, wheezing or rhonchi  ABDOMEN: Soft, non-tender, non-distended MUSCULOSKELETAL:  No edema; No  deformity  SKIN: Warm and dry NEUROLOGIC:  Alert and oriented x 3 PSYCHIATRIC:  Normal affect   ASSESSMENT:    1. Aortic valve stenosis, etiology of cardiac valve disease unspecified   2. Essential hypertension, benign   3. Hyperlipidemia, unspecified hyperlipidemia type       PLAN:    Aortic stenosis: Moderate on echocardiogram 11/26/2021.   Echocardiogram 10/2022 showed EF 60 to 65%, normal RV function, moderate to severe aortic stenosis (V-max 2.7 m/s, mean gradient 18 mmHg, AVA 1.0 cm, DI 0.33).  Echocardiogram 10/2023 showed EF 55 to 60%, normal RV function, moderate aortic stenosis (V-max 2.7 m/s, mean gradient 16 mmHg, AVA  1.2 cm, DI 0.41). -Remains asymptomatic, appears moderate AS on echo.  Plan repeat echocardiogram in 1 year  Hypertension: On amlodipine 7.5 mg daily.  Appears controlled  Hyperlipidemia: Did not tolerate statin or Zetia.  LDL 202 on 09/03/2021.  Calcium score 12/06/2021 with 210 (83rd percentile).  Likely FH.  Started on Repatha with significant improvement, LDL 66 on 11/18/2022.  Reports having upcoming labs drawn with PCP, will follow-up results -Continue Repatha  RTC in 1 year  Medication Adjustments/Labs and Tests Ordered: Current medicines are reviewed at length with the patient today.  Concerns regarding medicines are outlined above.  Orders Placed This Encounter  Procedures   EKG 12-Lead   Meds ordered this encounter  Medications   DISCONTD: amLODipine (NORVASC) 5 MG tablet    Sig: Take 1.5 tablets (7.5 mg total) by mouth daily.    Dispense:  90 tablet    Refill:  0    Patient Instructions  Medication Instructions:  Continue take medication as discussed with your provider Amlodipine 7.5 mg daily Continue all current medications *If you need a refill on your cardiac medications before your next appointment, please call your pharmacy*   Lab Work: none If you have labs (blood work) drawn today and your tests are completely normal, you will  receive your results only by: MyChart Message (if you have MyChart) OR A paper copy in the mail If you have any lab test that is abnormal or we need to change your treatment, we will call you to review the results.   Testing/Procedures: none   Follow-Up: At [facility], you and your health needs are our priority.  As part of our continuing mission to provide you with exceptional heart care, we have created designated Provider Care Teams.  These Care Teams include your primary Cardiologist (physician) and Advanced Practice Providers (APPs -  Physician Assistants and Nurse Practitioners) who all work together to provide you with the care you need, when you need it.  We recommend signing up for the patient portal called "MyChart".  Sign up information is provided on this After Visit Summary.  MyChart is used to connect with patients for Virtual Visits (Telemedicine).  Patients are able to view lab/test results, encounter notes, upcoming appointments, etc.  Non-urgent messages can be sent to your provider as well.   To learn more about what you can do with MyChart, go to [email or web address].    Your next appointment:   Post Echo f/u  please schedule next visit  jan 20206  Provider:   Dr. Bjorn Pippin  Other Instructions none         Signed, Little Ishikawa, MD  11/26/2023 2:55 PM    [facility]

## 2023-11-26 NOTE — Patient Instructions (Addendum)
Medication Instructions:  Continue take medication as discussed with your provider Amlodipine 7.5 mg daily Continue all current medications *If you need a refill on your cardiac medications before your next appointment, please call your pharmacy*   Lab Work: none If you have labs (blood work) drawn today and your tests are completely normal, you will receive your results only by: MyChart Message (if you have MyChart) OR A paper copy in the mail If you have any lab test that is abnormal or we need to change your treatment, we will call you to review the results.   Testing/Procedures: none   Follow-Up: At [facility], you and your health needs are our priority.  As part of our continuing mission to provide you with exceptional heart care, we have created designated Provider Care Teams.  These Care Teams include your primary Cardiologist (physician) and Advanced Practice Providers (APPs -  Physician Assistants and Nurse Practitioners) who all work together to provide you with the care you need, when you need it.  We recommend signing up for the patient portal called "MyChart".  Sign up information is provided on this After Visit Summary.  MyChart is used to connect with patients for Virtual Visits (Telemedicine).  Patients are able to view lab/test results, encounter notes, upcoming appointments, etc.  Non-urgent messages can be sent to your provider as well.   To learn more about what you can do with MyChart, go to [email or web address].    Your next appointment:   Post Echo f/u  please schedule next visit  jan 20206  Provider:   Dr. Bjorn Pippin  Other Instructions none

## 2023-12-02 ENCOUNTER — Other Ambulatory Visit (HOSPITAL_COMMUNITY): Payer: Self-pay

## 2023-12-02 ENCOUNTER — Telehealth: Payer: Self-pay | Admitting: Pharmacy Technician

## 2023-12-02 NOTE — Telephone Encounter (Signed)
Pharmacy Patient Advocate Encounter   Received notification from Patient Advice Request messages that prior authorization for repatha is required/requested.   Insurance verification completed.   The patient is insured through [facility] .   Per test claim: PA required; PA submitted to above mentioned insurance via CoverMyMeds Key/confirmation #/EOC [id number] Status is pending

## 2023-12-02 NOTE — Telephone Encounter (Signed)
Pharmacy Patient Advocate Encounter  Received notification from [facility] that Prior Authorization for repatha has been CANCELLED due to plan says PA not needed. Test claim says too soon to fill   PA #/Case ID/Reference #: [id number]

## 2023-12-16 ENCOUNTER — Encounter: Payer: Self-pay | Admitting: Internal Medicine

## 2023-12-16 ENCOUNTER — Ambulatory Visit (INDEPENDENT_AMBULATORY_CARE_PROVIDER_SITE_OTHER): Payer: Medicare Other | Admitting: Internal Medicine

## 2023-12-16 VITALS — BP 121/71 | HR 82 | Ht 67.0 in | Wt 172.8 lb

## 2023-12-16 DIAGNOSIS — I10 Essential (primary) hypertension: Secondary | ICD-10-CM | POA: Diagnosis not present

## 2023-12-16 DIAGNOSIS — R7303 Prediabetes: Secondary | ICD-10-CM | POA: Diagnosis not present

## 2023-12-16 DIAGNOSIS — E559 Vitamin D deficiency, unspecified: Secondary | ICD-10-CM

## 2023-12-16 DIAGNOSIS — I350 Nonrheumatic aortic (valve) stenosis: Secondary | ICD-10-CM | POA: Diagnosis not present

## 2023-12-16 DIAGNOSIS — E782 Mixed hyperlipidemia: Secondary | ICD-10-CM | POA: Diagnosis not present

## 2023-12-16 DIAGNOSIS — Z0001 Encounter for general adult medical examination with abnormal findings: Secondary | ICD-10-CM | POA: Diagnosis not present

## 2023-12-16 NOTE — Progress Notes (Signed)
 Established Patient Office Visit  Subjective:  Patient ID: Tonya Pacheco, female    DOB: 23-Jul-1946  Age: 78 y.o. MRN: [id number]  CC:  Chief Complaint  Patient presents with   Annual Exam    Annual physical     HPI Tonya Pacheco is a 78 y.o. female with past medical history of HTN, osteopenia and HLD who presents for f/u of her chronic medical conditions.  She has been doing well overall.   BP is wnl now. Takes amlodipine 7.5 mg QD regularly now. Her home BP readings have been wnl - 120s/70s, as well.  She has noticed recent worsening of leg swelling and weight gain since since increasing dose of amlodipine to 7.5 mg QD from 5 mg QD. Patient denies headache, dizziness, chest pain, dyspnea or palpitations.  She has a history of HLD, but had myositis with statin. She has started Repatha, and is tolerating it well.  She thought that her blood pressure was elevated partially due to Repatha as she read on Internet, but she has seen better BP readings since increasing dose of Amlodipine.  Past Medical History:  Diagnosis Date   Hypertension     Past Surgical History:  Procedure Laterality Date   COLONOSCOPY WITH PROPOFOL N/A 09/15/2020   Procedure: COLONOSCOPY WITH PROPOFOL;  Surgeon: Dolores Frame, MD;  [facility]: [facility];  Service: Gastroenterology;  Laterality: N/A;  915    Family History  Problem Relation Age of Onset   Hyperlipidemia Mother    Hyperlipidemia Father    Arthritis Sister 17       Rheumatoid Arthritis   Heart disease Sister    Lupus Sister    Kidney disease Brother 19       Kidney transplant   Cancer Maternal Grandmother    Breast cancer Neg Hx     Social History   Socioeconomic History   Marital status: Widowed    Spouse name: Not on file   Number of children: 4   Years of education: Not on file   Highest education level: Bachelor's degree (e.g., BA, AB, BS)  Occupational History   Not on file  Tobacco Use   Smoking status: Never    Smokeless tobacco: Never  Vaping Use   Vaping status: Never Used  Substance and Sexual Activity   Alcohol use: No   Drug use: No   Sexual activity: Not Currently  Other Topics Concern   Not on file  Social History Narrative   Working Scientific laboratory technician   Social Drivers of Health   Financial Resource Strain: Low Risk  (12/09/2023)   Overall Financial Resource Strain (CARDIA)    Difficulty of Paying Living Expenses: Not hard at all  Food Insecurity: No Food Insecurity (12/09/2023)   Hunger Vital Sign    Worried About Running Out of Food in the Last Year: Never true    Ran Out of Food in the Last Year: Never true  Transportation Needs: No Transportation Needs (12/09/2023)   PRAPARE - Administrator, Civil Service (Medical): No    Lack of Transportation (Non-Medical): No  Physical Activity: Sufficiently Active (12/09/2023)   Exercise Vital Sign    Days of Exercise per Week: 7 days    Minutes of Exercise per Session: 30 min  Stress: No Stress Concern Present (12/09/2023)   [employer] of Occupational Health - Occupational Stress Questionnaire    Feeling of Stress : Not at all  Social Connections: Moderately Integrated (12/09/2023)  Social Advertising account executive [NHANES]    Frequency of Communication with Friends and Family: More than three times a week    Frequency of Social Gatherings with Friends and Family: More than three times a week    Attends Religious Services: More than 4 times per year    Active Member of [employer] or Organizations: Yes    Attends Banker Meetings: More than 4 times per year    Marital Status: Widowed  Intimate Partner Violence: Not At Risk (08/27/2023)   Humiliation, Afraid, Rape, and Kick questionnaire    Fear of Current or Ex-Partner: No    Emotionally Abused: No    Physically Abused: No    Sexually Abused: No    Outpatient Medications Prior to Visit  Medication Sig Dispense Refill   amLODipine (NORVASC) 5 MG  tablet Take 1.5 tablets (7.5 mg total) by mouth daily.     aspirin 81 MG tablet Take 81 mg by mouth daily.     calcium carbonate (OS-CAL) 600 MG TABS tablet Take 600 mg by mouth every evening.      Cholecalciferol (D3 2000 PO) Take 2,000 Units by mouth daily.     Evolocumab (REPATHA SURECLICK) 140 MG/ML SOAJ Inject 140 mg into the skin every 14 (fourteen) days. 2 mL 11   Multiple Vitamins-Minerals (CENTRUM ADULTS PO) Take 1 tablet by mouth every evening.      Chlorphen-PE-Acetaminophen (NOREL AD) 4-10-325 MG TABS Take 1 tablet by mouth 3 (three) times daily as needed (Nasal congestion). 30 tablet 0   No facility-administered medications prior to visit.    Allergies  Allergen Reactions   Penicillins Swelling    Swelling mouth/ tongue    Zetia [Ezetimibe] Swelling    Eyes puffy, tongue swelling   Statins Other (See Comments)    Muscle/joint aches    ROS Review of Systems  Constitutional:  Negative for chills and fever.  HENT:  Negative for congestion, sinus pressure, sinus pain and sore throat.   Eyes:  Negative for pain and discharge.  Respiratory:  Negative for cough and shortness of breath.   Cardiovascular:  Negative for chest pain and palpitations.  Gastrointestinal:  Negative for abdominal pain, constipation, diarrhea, nausea and vomiting.  Endocrine: Negative for polydipsia and polyuria.  Genitourinary:  Negative for dysuria and hematuria.  Musculoskeletal:  Negative for neck pain and neck stiffness.  Skin:  Negative for rash.  Neurological:  Negative for dizziness and weakness.  Psychiatric/Behavioral:  Negative for agitation and behavioral problems.       Objective:    Physical Exam Vitals reviewed.  Constitutional:      General: She is not in acute distress.    Appearance: She is not diaphoretic.  HENT:     Head: Normocephalic and atraumatic.     Nose: Nose normal.     Mouth/Throat:     Mouth: Mucous membranes are moist.  Eyes:     General: No scleral  icterus.    Extraocular Movements: Extraocular movements intact.  Cardiovascular:     Rate and Rhythm: Normal rate and regular rhythm.     Pulses: Normal pulses.     Heart sounds: Murmur (Systolic, most profound at left upper sternal border) heard.  Pulmonary:     Breath sounds: Normal breath sounds. No wheezing or rales.  Abdominal:     Palpations: Abdomen is soft.     Tenderness: There is no abdominal tenderness.  Musculoskeletal:     Cervical back: Neck supple. No  tenderness.     Right lower leg: Edema (1+) present.     Left lower leg: Edema (1+) present.  Skin:    General: Skin is warm.     Findings: No rash.  Neurological:     General: No focal deficit present.     Mental Status: She is alert and oriented to person, place, and time.     Cranial Nerves: No cranial nerve deficit.     Sensory: No sensory deficit.     Motor: No weakness.  Psychiatric:        Mood and Affect: Mood normal.        Behavior: Behavior normal.     BP 121/71   Pulse 82   Ht 5\' 7"  (1.702 m)   Wt 172 lb 12.8 oz (78.4 kg)   SpO2 98%   BMI 27.06 kg/m  Wt Readings from Last 3 Encounters:  12/16/23 172 lb 12.8 oz (78.4 kg)  11/26/23 172 lb (78 kg)  08/27/23 164 lb (74.4 kg)    Lab Results  Component Value Date   TSH 2.760 11/18/2022   Lab Results  Component Value Date   WBC 6.8 11/18/2022   HGB 11.8 11/18/2022   HCT 36.5 11/18/2022   MCV 92 11/18/2022   PLT 304 11/18/2022   Lab Results  Component Value Date   NA 140 11/18/2022   K 4.3 11/18/2022   CO2 26 11/18/2022   GLUCOSE 91 11/18/2022   BUN 12 11/18/2022   CREATININE 0.78 11/18/2022   BILITOT 0.4 11/18/2022   ALKPHOS 91 11/18/2022   AST 12 11/18/2022   ALT 10 11/18/2022   PROT 7.0 11/18/2022   ALBUMIN 4.2 11/18/2022   CALCIUM 9.2 11/18/2022   ANIONGAP 6 07/01/2018   EGFR 80 11/18/2022   Lab Results  Component Value Date   CHOL 141 11/18/2022   Lab Results  Component Value Date   HDL 63 11/18/2022   Lab Results   Component Value Date   LDLCALC 66 11/18/2022   Lab Results  Component Value Date   TRIG 56 11/18/2022   Lab Results  Component Value Date   CHOLHDL 2.2 11/18/2022   Lab Results  Component Value Date   HGBA1C 6.4 (H) 07/15/2023      Assessment & Plan:   Problem List Items Addressed This Visit       Cardiovascular and Mediastinum   Essential hypertension, benign   BP Readings from Last 1 Encounters:  12/16/23 121/71   Well-controlled with Amlodipine 7.5 mg once daily Considering recent worsening of leg swelling, advised to try amlodipine 5 mg QD and report BP readings in 2 weeks if more than 140/90 Counseled for compliance with the medications Advised DASH diet and moderate exercise/walking, at least 150 mins/week      Relevant Orders   TSH   CMP14+EGFR   CBC with Differential/Platelet   Nonrheumatic aortic valve stenosis   Echo reviewed (01/25) Currently asymptomatic Repeat Echo in 1 year Followed by Cardiology        Other   Hyperlipidemia   Check lipid profile Did not tolerate statin and Zetia in the past On Repatha now Continue to follow low carb and low cholesterol diet      Relevant Orders   Lipid panel   Encounter for general adult medical examination with abnormal findings - Primary   Annual exam as documented. Counseling done  re healthy lifestyle involving commitment to 150 minutes exercise per week, heart healthy diet, and attaining  healthy weight.The importance of adequate sleep also discussed. Immunization and cancer screening needs are specifically addressed at this visit.      Prediabetes   Lab Results  Component Value Date   HGBA1C 6.4 (H) 07/15/2023   Advised to follow low carb diet for now Check HbA1c      Relevant Orders   Hemoglobin A1c   CMP14+EGFR   Other Visit Diagnoses       Vitamin D deficiency       Relevant Orders   VITAMIN D 25 Hydroxy (Vit-D Deficiency, Fractures)         No orders of the defined types  were placed in this encounter.   Follow-up: Return in about 6 months (around 06/14/2024) for HTN.    Anabel Halon, MD

## 2023-12-16 NOTE — Assessment & Plan Note (Signed)
 Lab Results  Component Value Date   HGBA1C 6.4 (H) 07/15/2023   Advised to follow low carb diet for now Check HbA1c

## 2023-12-16 NOTE — Patient Instructions (Addendum)
 Please schedule Mammogram.  Please take 1 tablet of Amlodipine 5 mg once daily for 2 weeks. If blood pressure remains below 140/90, please continue only 1 tablet of Amlodipine. If blood pressure goes above 140/90, please resume 1.5 tablet of Amlodipine.  Please continue to follow low carb diet and perform moderate exercise/walking at least 150 mins/week.  Please consider getting Shingrix and Tdap vaccine at local pharmacy.

## 2023-12-16 NOTE — Assessment & Plan Note (Addendum)
 Echo reviewed (01/25) Currently asymptomatic Repeat Echo in 1 year Followed by Cardiology

## 2023-12-16 NOTE — Assessment & Plan Note (Signed)
 Annual exam as documented. Counseling done  re healthy lifestyle involving commitment to 150 minutes exercise per week, heart healthy diet, and attaining healthy weight.The importance of adequate sleep also discussed.  Immunization and cancer screening needs are specifically addressed at this visit.

## 2023-12-16 NOTE — Assessment & Plan Note (Signed)
 Check lipid profile Did not tolerate statin and Zetia in the past On Repatha now Continue to follow low carb and low cholesterol diet

## 2023-12-16 NOTE — Assessment & Plan Note (Signed)
 BP Readings from Last 1 Encounters:  12/16/23 121/71   Well-controlled with Amlodipine 7.5 mg once daily Considering recent worsening of leg swelling, advised to try amlodipine 5 mg QD and report BP readings in 2 weeks if more than 140/90 Counseled for compliance with the medications Advised DASH diet and moderate exercise/walking, at least 150 mins/week

## 2023-12-17 LAB — CMP14+EGFR
ALT: 20 [IU]/L (ref 0–32)
AST: 21 [IU]/L (ref 0–40)
Albumin: 4.3 g/dL (ref 3.8–4.8)
Alkaline Phosphatase: 85 [IU]/L (ref 44–121)
BUN/Creatinine Ratio: 19 (ref 12–28)
BUN: 20 mg/dL (ref 8–27)
Bilirubin Total: 0.5 mg/dL (ref 0.0–1.2)
CO2: 23 mmol/L (ref 20–29)
Calcium: 9.1 mg/dL (ref 8.7–10.3)
Chloride: 104 mmol/L (ref 96–106)
Creatinine, Ser: 1.04 mg/dL — ABNORMAL HIGH (ref 0.57–1.00)
Globulin, Total: 2.6 g/dL (ref 1.5–4.5)
Glucose: 82 mg/dL (ref 70–99)
Potassium: 4.3 mmol/L (ref 3.5–5.2)
Sodium: 141 mmol/L (ref 134–144)
Total Protein: 6.9 g/dL (ref 6.0–8.5)
eGFR: 56 mL/min/{1.73_m2} — ABNORMAL LOW (ref >59)

## 2023-12-17 LAB — CBC WITH DIFFERENTIAL/PLATELET
Basophils Absolute: 0 10*3/uL (ref 0.0–0.2)
Basos: 1 %
EOS (ABSOLUTE): 0.1 10*3/uL (ref 0.0–0.4)
Eos: 2 %
Hematocrit: 37.5 % (ref 34.0–46.6)
Hemoglobin: 12.4 g/dL (ref 11.1–15.9)
Immature Grans (Abs): 0 10*3/uL (ref 0.0–0.1)
Immature Granulocytes: 0 %
Lymphocytes Absolute: 2.7 10*3/uL (ref 0.7–3.1)
Lymphs: 45 %
MCH: 30.5 pg (ref 26.6–33.0)
MCHC: 33.1 g/dL (ref 31.5–35.7)
MCV: 92 fL (ref 79–97)
Monocytes Absolute: 0.4 10*3/uL (ref 0.1–0.9)
Monocytes: 8 %
Neutrophils Absolute: 2.5 10*3/uL (ref 1.4–7.0)
Neutrophils: 44 %
Platelets: 229 10*3/uL (ref 150–450)
RBC: 4.06 x10E6/uL (ref 3.77–5.28)
RDW: 12.7 % (ref 11.7–15.4)
WBC: 5.8 10*3/uL (ref 3.4–10.8)

## 2023-12-17 LAB — LIPID PANEL
Chol/HDL Ratio: 2.3 {ratio} (ref 0.0–4.4)
Cholesterol, Total: 167 mg/dL (ref 100–199)
HDL: 74 mg/dL (ref >39)
LDL Chol Calc (NIH): 84 mg/dL (ref 0–99)
Triglycerides: 42 mg/dL (ref 0–149)
VLDL Cholesterol Cal: 9 mg/dL (ref 5–40)

## 2023-12-17 LAB — VITAMIN D 25 HYDROXY (VIT D DEFICIENCY, FRACTURES): Vit D, 25-Hydroxy: 53.1 ng/mL (ref 30.0–100.0)

## 2023-12-17 LAB — HEMOGLOBIN A1C
Est. average glucose Bld gHb Est-mCnc: 131 mg/dL
Hgb A1c MFr Bld: 6.2 % — ABNORMAL HIGH (ref 4.8–5.6)

## 2023-12-17 LAB — TSH: TSH: 2.8 u[IU]/mL (ref 0.450–4.500)

## 2023-12-27 ENCOUNTER — Emergency Department (HOSPITAL_COMMUNITY)
Admission: EM | Admit: 2023-12-27 | Discharge: 2023-12-27 | Disposition: A | Discharge status: Home / Self Care | Attending: Emergency Medicine | Admitting: Emergency Medicine

## 2023-12-27 ENCOUNTER — Emergency Department (HOSPITAL_COMMUNITY)

## 2023-12-27 ENCOUNTER — Encounter (HOSPITAL_COMMUNITY): Payer: Self-pay | Admitting: Emergency Medicine

## 2023-12-27 ENCOUNTER — Other Ambulatory Visit: Payer: Self-pay

## 2023-12-27 DIAGNOSIS — M25462 Effusion, left knee: Secondary | ICD-10-CM | POA: Diagnosis not present

## 2023-12-27 DIAGNOSIS — S8392XA Sprain of unspecified site of left knee, initial encounter: Secondary | ICD-10-CM | POA: Insufficient documentation

## 2023-12-27 DIAGNOSIS — W07XXXA Fall from chair, initial encounter: Secondary | ICD-10-CM | POA: Diagnosis not present

## 2023-12-27 DIAGNOSIS — S838X2A Sprain of other specified parts of left knee, initial encounter: Secondary | ICD-10-CM | POA: Diagnosis not present

## 2023-12-27 DIAGNOSIS — M25562 Pain in left knee: Secondary | ICD-10-CM | POA: Diagnosis not present

## 2023-12-27 DIAGNOSIS — Z7982 Long term (current) use of aspirin: Secondary | ICD-10-CM | POA: Diagnosis not present

## 2023-12-27 DIAGNOSIS — S8992XA Unspecified injury of left lower leg, initial encounter: Secondary | ICD-10-CM | POA: Diagnosis not present

## 2023-12-27 DIAGNOSIS — M1712 Unilateral primary osteoarthritis, left knee: Secondary | ICD-10-CM | POA: Diagnosis not present

## 2023-12-27 MED ORDER — NAPROXEN 500 MG PO TABS: 500.0000 mg | ORAL_TABLET | Freq: Two times a day (BID) | ORAL | 0 refills | Status: DC

## 2023-12-27 MED ORDER — NAPROXEN 250 MG PO TABS
500.0000 mg | ORAL_TABLET | Freq: Once | ORAL | Status: AC
  Administered 2023-12-27: 500 mg via ORAL
  Filled 2023-12-27: qty 2

## 2023-12-27 NOTE — ED Triage Notes (Signed)
 Pt here with c/o L knee pain after her dog caused her to fall around 6pm yesterday. Pt ambulatory with a limp.

## 2023-12-27 NOTE — ED Provider Notes (Signed)
 [place] EMERGENCY DEPARTMENT AT [facility] Provider Note   CSN: [id number] Arrival date & time: 12/27/23  8295     History  Chief Complaint  Patient presents with   Fall    Tonya Pacheco is a 78 y.o. female.  Patient is states that she fell off a chair and try to avoid hitting her dog and landed on her left lateral knee with pain in that area.  Patient says she will walk but with a limp and it has quite a bit more painful when she is walking.  She states swelling.  This happened this evening.  She came here for further evaluation.  Took an aspirin but no other medications prior to arrival.  No injuries elsewhere.   Fall       Home Medications Prior to Admission medications   Medication Sig Start Date End Date Taking? Authorizing Provider  naproxen (NAPROSYN) 500 MG tablet Take 1 tablet (500 mg total) by mouth 2 (two) times daily. 12/27/23  Yes Keirstin Musil, Barbara Cower, MD  amLODipine (NORVASC) 5 MG tablet Take 1.5 tablets (7.5 mg total) by mouth daily. 11/26/23   Little Ishikawa, MD  aspirin 81 MG tablet Take 81 mg by mouth daily.    [provider]  calcium carbonate (OS-CAL) 600 MG TABS tablet Take 600 mg by mouth every evening.     [provider]  Cholecalciferol (D3 2000 PO) Take 2,000 Units by mouth daily.    [provider]  Evolocumab (REPATHA SURECLICK) 140 MG/ML SOAJ Inject 140 mg into the skin every 14 (fourteen) days. 11/18/23   Anabel Halon, MD  Multiple Vitamins-Minerals (CENTRUM ADULTS PO) Take 1 tablet by mouth every evening.     [provider]      Allergies    Penicillins, Zetia [ezetimibe], and Statins    Review of Systems   Review of Systems  Physical Exam Updated Vital Signs BP (!) 154/89 (BP Location: Right Arm)   Pulse (!) 103   Temp 98.7 F (37.1 C) (Oral)   Resp 16   Ht 5\' 7"  (1.702 m)   Wt 78 kg   SpO2 98%   BMI 26.93 kg/m  Physical Exam Vitals and nursing note reviewed.  Constitutional:       Appearance: She is well-developed.  HENT:     Head: Normocephalic and atraumatic.  Cardiovascular:     Rate and Rhythm: Normal rate and regular rhythm.  Pulmonary:     Effort: No respiratory distress.     Breath sounds: No stridor.  Abdominal:     General: There is no distension.  Musculoskeletal:        General: Swelling (Mild around her left patella.  Mild tenderness lateral knee.  Stable.) present.     Cervical back: Normal range of motion.  Neurological:     Mental Status: She is alert.     ED Results / Procedures / Treatments   Labs (all labs ordered are listed, but only abnormal results are displayed) Labs Reviewed - No data to display  EKG None  Radiology DG Knee Complete 4 Views Left Result Date: 12/27/2023 CLINICAL DATA:  Fall with injury.  Knee pain. EXAM: LEFT KNEE - COMPLETE 4+ VIEW COMPARISON:  None Available. FINDINGS: Trace joint effusion. No signs of acute fracture or dislocation. 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 Soft tissues are unremarkable. IMPRESSION: 1. No acute findings. 2. Trace joint effusion. 3. 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 Electronically Signed  By: Signa Kell M.D.   On: 12/27/2023 06:05    Procedures Procedures    Medications Ordered in ED Medications  naproxen (NAPROSYN) tablet 500 mg (500 mg Oral Given 12/27/23 0607)    ED Course/ Medical Decision Making/ A&P                                 Medical Decision Making Risk Prescription drug management.   Patient with mechanical fall and left knee pain/swelling. Xr viewed and interpreted by myself without obvious fracture. Rads read reviewed and agreed. Knee sleeve/NSAIDs/Ice. PCP follow up in a week if not improving.    Final Clinical Impression(s) / ED Diagnoses Final diagnoses:  Sprain of left knee, unspecified ligament, initial encounter    Rx / DC Orders ED Discharge Orders          Ordered    naproxen  (NAPROSYN) 500 MG tablet  2 times daily        12/27/23 0533              Aiyana Stegmann, Barbara Cower, MD 12/27/23 [phone]

## 2024-01-12 DIAGNOSIS — K080 Exfoliation of teeth due to systemic causes: Secondary | ICD-10-CM | POA: Diagnosis not present

## 2024-01-16 ENCOUNTER — Encounter (HOSPITAL_COMMUNITY): Payer: Self-pay | Admitting: Pharmacy Technician

## 2024-01-16 ENCOUNTER — Telehealth: Payer: Self-pay | Admitting: Pharmacy Technician

## 2024-01-16 ENCOUNTER — Other Ambulatory Visit (HOSPITAL_COMMUNITY): Payer: Self-pay

## 2024-01-16 NOTE — Telephone Encounter (Signed)
 Pharmacy Patient Advocate Encounter  Received notification from [facility] that Prior Authorization for Repatha SureClick 140MG /ML auto-injectors has been APPROVED from 01/16/2024 to 01/15/2025. Ran test claim, Copay is $45.00. This test claim was processed through [facility]- copay amounts may vary at other pharmacies due to pharmacy/plan contracts, or as the patient moves through the different stages of their insurance plan.   PA #/Case ID/Reference #: [id number]

## 2024-01-16 NOTE — Telephone Encounter (Signed)
 Pharmacy Patient Advocate Encounter   Received notification from CoverMyMeds that prior authorization for Repatha SureClick 140MG /ML auto-injectors is required/requested.   Insurance verification completed.   The patient is insured through [facility] .   Per test claim: PA required; PA submitted to above mentioned insurance via CoverMyMeds Key/confirmation #/EOC [id number] Status is pending

## 2024-01-16 NOTE — Telephone Encounter (Signed)
 ERROR

## 2024-01-23 ENCOUNTER — Other Ambulatory Visit: Payer: Self-pay | Admitting: Internal Medicine

## 2024-02-03 DIAGNOSIS — K080 Exfoliation of teeth due to systemic causes: Secondary | ICD-10-CM | POA: Diagnosis not present

## 2024-04-09 ENCOUNTER — Ambulatory Visit
Admission: RE | Admit: 2024-04-09 | Discharge: 2024-04-09 | Disposition: A | Discharge status: Home / Self Care | Source: Ambulatory Visit | Attending: Internal Medicine | Admitting: Internal Medicine

## 2024-04-09 DIAGNOSIS — Z1231 Encounter for screening mammogram for malignant neoplasm of breast: Secondary | ICD-10-CM

## 2024-04-13 DIAGNOSIS — K080 Exfoliation of teeth due to systemic causes: Secondary | ICD-10-CM | POA: Diagnosis not present

## 2024-04-23 ENCOUNTER — Other Ambulatory Visit: Payer: Self-pay | Admitting: Internal Medicine

## 2024-04-27 DIAGNOSIS — K080 Exfoliation of teeth due to systemic causes: Secondary | ICD-10-CM | POA: Diagnosis not present

## 2024-06-14 ENCOUNTER — Ambulatory Visit: Payer: Medicare Other | Admitting: Internal Medicine

## 2024-07-04 DIAGNOSIS — R030 Elevated blood-pressure reading, without diagnosis of hypertension: Secondary | ICD-10-CM | POA: Diagnosis not present

## 2024-07-04 DIAGNOSIS — T63461A Toxic effect of venom of wasps, accidental (unintentional), initial encounter: Secondary | ICD-10-CM | POA: Diagnosis not present

## 2024-07-23 ENCOUNTER — Other Ambulatory Visit: Payer: Self-pay | Admitting: Internal Medicine

## 2024-07-28 ENCOUNTER — Ambulatory Visit (INDEPENDENT_AMBULATORY_CARE_PROVIDER_SITE_OTHER): Admitting: Internal Medicine

## 2024-07-28 ENCOUNTER — Encounter: Payer: Self-pay | Admitting: Internal Medicine

## 2024-07-28 VITALS — BP 136/76 | HR 69 | Ht 67.0 in | Wt 175.4 lb

## 2024-07-28 DIAGNOSIS — E782 Mixed hyperlipidemia: Secondary | ICD-10-CM

## 2024-07-28 DIAGNOSIS — R7989 Other specified abnormal findings of blood chemistry: Secondary | ICD-10-CM | POA: Diagnosis not present

## 2024-07-28 DIAGNOSIS — F4320 Adjustment disorder, unspecified: Secondary | ICD-10-CM | POA: Diagnosis not present

## 2024-07-28 DIAGNOSIS — I10 Essential (primary) hypertension: Secondary | ICD-10-CM

## 2024-07-28 NOTE — Assessment & Plan Note (Deleted)
 Lab Results  Component Value Date   HGBA1C 6.2 (H) 12/16/2023   Advised to follow low carb diet for now Check HbA1c

## 2024-07-28 NOTE — Patient Instructions (Signed)
 Please continue to take medications as prescribed.  Please continue to follow low salt diet and perform moderate exercise/walking at least 150 mins/week.  Please maintain at least 64 ounces of fluid intake in a day.

## 2024-07-28 NOTE — Assessment & Plan Note (Signed)
 Lost her brother in 08/25 She has adequate family support Has insomnia and anhedonia, but slowly improving

## 2024-07-28 NOTE — Progress Notes (Signed)
 Established Patient Office Visit  Subjective:  Patient ID: Tonya Pacheco, female    DOB: [birth year]  Age: 78 y.o. MRN: [id number]  CC:  Chief Complaint  Patient presents with   Medical Management of Chronic Issues    6 month f/u , reports a loss in the family, is trying to stay on track with her sleep and meals.     HPI Tonya Pacheco is a 77 y.o. female with past medical history of HTN, osteopenia and HLD who presents for f/u of her chronic medical conditions.   BP is wnl now. Takes amlodipine  5 mg QD regularly now. Her home BP readings have been wnl - 120s/70s, as well.  Her leg swelling has improved since decreasing dose of amlodipine .  Patient denies headache, dizziness, chest pain, dyspnea or palpitations.  She has a history of HLD, but had myositis with statin. She has been taking Repatha , and is tolerating it well.  She lost her brother about 2 months ago.  She was feeling anxious and had insomnia initially, but reports slow improvement recently.  She is in constant touch with her family and reports having adequate family support.  Past Medical History:  Diagnosis Date   Hypertension     Past Surgical History:  Procedure Laterality Date   COLONOSCOPY WITH PROPOFOL  N/A 09/15/2020   Procedure: COLONOSCOPY WITH PROPOFOL ;  Surgeon: Eartha Angelia Sieving, MD;  Location: [facility];  Service: Gastroenterology;  Laterality: N/A;  915    Family History  Problem Relation Age of Onset   Hyperlipidemia Mother    Hyperlipidemia Father    Arthritis Sister 51       Rheumatoid Arthritis   Heart disease Sister    Lupus Sister    Kidney disease Brother 62       Kidney transplant   Cancer Maternal Grandmother    Breast cancer Neg Hx     Social History   Socioeconomic History   Marital status: Widowed    Spouse name: Not on file   Number of children: 4   Years of education: Not on file   Highest education level: Bachelor's degree (e.g., BA, AB, BS)  Occupational History    Not on file  Tobacco Use   Smoking status: Never   Smokeless tobacco: Never  Vaping Use   Vaping status: Never Used  Substance and Sexual Activity   Alcohol use: No   Drug use: No   Sexual activity: Not Currently  Other Topics Concern   Not on file  Social History Narrative   Working Scientific laboratory technician   Social Drivers of Health   Financial Resource Strain: Low Risk  (07/26/2024)   Overall Financial Resource Strain (CARDIA)    Difficulty of Paying Living Expenses: Not very hard  Food Insecurity: No Food Insecurity (07/26/2024)   Hunger Vital Sign    Worried About Running Out of Food in the Last Year: Never true    Ran Out of Food in the Last Year: Never true  Transportation Needs: No Transportation Needs (07/26/2024)   PRAPARE - Administrator, Civil Service (Medical): No    Lack of Transportation (Non-Medical): No  Physical Activity: Sufficiently Active (07/26/2024)   Exercise Vital Sign    Days of Exercise per Week: 6 days    Minutes of Exercise per Session: 30 min  Stress: No Stress Concern Present (07/26/2024)   [employer] of Occupational Health - Occupational Stress Questionnaire    Feeling of Stress: Not at all  Social Connections: Moderately Integrated (07/26/2024)   Social Connection and Isolation Panel    Frequency of Communication with Friends and Family: More than three times a week    Frequency of Social Gatherings with Friends and Family: More than three times a week    Attends Religious Services: More than 4 times per year    Active Member of [employer] or Organizations: Yes    Attends Banker Meetings: 1 to 4 times per year    Marital Status: Widowed  Intimate Partner Violence: Not At Risk (08/27/2023)   Humiliation, Afraid, Rape, and Kick questionnaire    Fear of Current or Ex-Partner: No    Emotionally Abused: No    Physically Abused: No    Sexually Abused: No    Outpatient Medications Prior to Visit  Medication Sig Dispense  Refill   amLODipine  (NORVASC ) 5 MG tablet TAKE 1 AND 1/2 TABLETS(7.5 MG) BY MOUTH DAILY 135 tablet 0   aspirin 81 MG tablet Take 81 mg by mouth daily.     calcium carbonate (OS-CAL) 600 MG TABS tablet Take 600 mg by mouth every evening.      Cholecalciferol (D3 2000 PO) Take 2,000 Units by mouth daily.     Evolocumab  (REPATHA  SURECLICK) 140 MG/ML SOAJ Inject 140 mg into the skin every 14 (fourteen) days. 2 mL 11   Multiple Vitamins-Minerals (CENTRUM ADULTS PO) Take 1 tablet by mouth every evening.      naproxen  (NAPROSYN ) 500 MG tablet Take 1 tablet (500 mg total) by mouth 2 (two) times daily. 30 tablet 0   No facility-administered medications prior to visit.    Allergies  Allergen Reactions   Penicillins Swelling    Swelling mouth/ tongue    Zetia  [Ezetimibe ] Swelling    Eyes puffy, tongue swelling   Statins Other (See Comments)    Muscle/joint aches    ROS Review of Systems  Constitutional:  Negative for chills and fever.  HENT:  Negative for congestion, sinus pressure, sinus pain and sore throat.   Eyes:  Negative for pain and discharge.  Respiratory:  Negative for cough and shortness of breath.   Cardiovascular:  Negative for chest pain and palpitations.  Gastrointestinal:  Negative for abdominal pain, constipation, diarrhea, nausea and vomiting.  Endocrine: Negative for polydipsia and polyuria.  Genitourinary:  Negative for dysuria and hematuria.  Musculoskeletal:  Negative for neck pain and neck stiffness.  Skin:  Negative for rash.  Neurological:  Negative for dizziness and weakness.  Psychiatric/Behavioral:  Negative for agitation and behavioral problems.       Objective:    Physical Exam Vitals reviewed.  Constitutional:      General: She is not in acute distress.    Appearance: She is not diaphoretic.  HENT:     Head: Normocephalic and atraumatic.     Nose: Nose normal.     Mouth/Throat:     Mouth: Mucous membranes are moist.  Eyes:     General: No  scleral icterus.    Extraocular Movements: Extraocular movements intact.  Cardiovascular:     Rate and Rhythm: Normal rate and regular rhythm.     Pulses: Normal pulses.     Heart sounds: Murmur (Systolic, most profound at left upper sternal border) heard.  Pulmonary:     Breath sounds: Normal breath sounds. No wheezing or rales.  Musculoskeletal:     Cervical back: Neck supple. No tenderness.     Right lower leg: No edema.     Left  lower leg: No edema.  Skin:    General: Skin is warm.     Findings: No rash.  Neurological:     General: No focal deficit present.     Mental Status: She is alert and oriented to person, place, and time.     Sensory: No sensory deficit.     Motor: No weakness.  Psychiatric:        Mood and Affect: Mood normal.        Behavior: Behavior normal.     BP 136/76 (BP Location: Left Arm)   Pulse 69   Ht 5' 7 (1.702 m)   Wt 175 lb 6.4 oz (79.6 kg)   SpO2 98%   BMI 27.47 kg/m  Wt Readings from Last 3 Encounters:  07/28/24 175 lb 6.4 oz (79.6 kg)  12/27/23 171 lb 15.3 oz (78 kg)  12/16/23 172 lb 12.8 oz (78.4 kg)    Lab Results  Component Value Date   TSH 2.800 12/16/2023   Lab Results  Component Value Date   WBC 5.8 12/16/2023   HGB 12.4 12/16/2023   HCT 37.5 12/16/2023   MCV 92 12/16/2023   PLT 229 12/16/2023   Lab Results  Component Value Date   NA 141 12/16/2023   K 4.3 12/16/2023   CO2 23 12/16/2023   GLUCOSE 82 12/16/2023   BUN 20 12/16/2023   CREATININE 1.04 (H) 12/16/2023   BILITOT 0.5 12/16/2023   ALKPHOS 85 12/16/2023   AST 21 12/16/2023   ALT 20 12/16/2023   PROT 6.9 12/16/2023   ALBUMIN 4.3 12/16/2023   CALCIUM 9.1 12/16/2023   ANIONGAP 6 07/01/2018   EGFR 56 (L) 12/16/2023   Lab Results  Component Value Date   CHOL 167 12/16/2023   Lab Results  Component Value Date   HDL 74 12/16/2023   Lab Results  Component Value Date   LDLCALC 84 12/16/2023   Lab Results  Component Value Date   TRIG 42 12/16/2023    Lab Results  Component Value Date   CHOLHDL 2.3 12/16/2023   Lab Results  Component Value Date   HGBA1C 6.2 (H) 12/16/2023      Assessment & Plan:   Problem List Items Addressed This Visit       Cardiovascular and Mediastinum   Essential hypertension, benign - Primary   BP Readings from Last 1 Encounters:  07/28/24 136/76   Well-controlled with Amlodipine  5 mg QD Considering worsening of leg swelling, had advised to try amlodipine  5 mg QD and report BP readings if more than 140/90 Counseled for compliance with the medications Advised DASH diet and moderate exercise/walking, at least 150 mins/week      Relevant Orders   CMP14+EGFR     Other   Hyperlipidemia   Check lipid profile Did not tolerate statin and Zetia  in the past On Repatha  now Continue to follow low carb and low cholesterol diet      Relevant Orders   Lipid Profile   Grief reaction   Lost her brother in 08/25 She has adequate family support Has insomnia and anhedonia, but slowly improving      Elevated serum creatinine   Had S. Cr. 1.04 and GFR 56 (02/25) Advised to maintain at least 64 ounces of fluid intake in a day Recheck CMP         No orders of the defined types were placed in this encounter.   Follow-up: Return in about 5 months (around 12/26/2024) for Annual physical.  Suzzane MARLA Blanch, MD

## 2024-07-28 NOTE — Assessment & Plan Note (Addendum)
 BP Readings from Last 1 Encounters:  07/28/24 136/76   Well-controlled with Amlodipine  5 mg QD Considering worsening of leg swelling, had advised to try amlodipine  5 mg QD and report BP readings if more than 140/90 Counseled for compliance with the medications Advised DASH diet and moderate exercise/walking, at least 150 mins/week

## 2024-07-28 NOTE — Assessment & Plan Note (Signed)
 Check lipid profile Did not tolerate statin and Zetia in the past On Repatha now Continue to follow low carb and low cholesterol diet

## 2024-07-28 NOTE — Assessment & Plan Note (Signed)
 Had S. Cr. 1.04 and GFR 56 (02/25) Advised to maintain at least 64 ounces of fluid intake in a day Recheck CMP

## 2024-07-29 ENCOUNTER — Ambulatory Visit: Payer: Self-pay | Admitting: Internal Medicine

## 2024-07-29 LAB — CMP14+EGFR
ALT: 21 IU/L (ref 0–32)
AST: 24 IU/L (ref 0–40)
Albumin: 4.3 g/dL (ref 3.8–4.8)
Alkaline Phosphatase: 99 IU/L (ref 49–135)
BUN/Creatinine Ratio: 15 (ref 12–28)
BUN: 15 mg/dL (ref 8–27)
Bilirubin Total: 0.4 mg/dL (ref 0.0–1.2)
CO2: 22 mmol/L (ref 20–29)
Calcium: 9.3 mg/dL (ref 8.7–10.3)
Chloride: 103 mmol/L (ref 96–106)
Creatinine, Ser: 1 mg/dL (ref 0.57–1.00)
Globulin, Total: 2.6 g/dL (ref 1.5–4.5)
Glucose: 115 mg/dL — ABNORMAL HIGH (ref 70–99)
Potassium: 4.3 mmol/L (ref 3.5–5.2)
Sodium: 140 mmol/L (ref 134–144)
Total Protein: 6.9 g/dL (ref 6.0–8.5)
eGFR: 58 mL/min/1.73 — ABNORMAL LOW (ref >59)

## 2024-07-29 LAB — LIPID PANEL
Chol/HDL Ratio: 2.6 ratio (ref 0.0–4.4)
Cholesterol, Total: 222 mg/dL — ABNORMAL HIGH (ref 100–199)
HDL: 86 mg/dL (ref >39)
LDL Chol Calc (NIH): 126 mg/dL — ABNORMAL HIGH (ref 0–99)
Triglycerides: 58 mg/dL (ref 0–149)
VLDL Cholesterol Cal: 10 mg/dL (ref 5–40)

## 2024-09-08 ENCOUNTER — Ambulatory Visit (INDEPENDENT_AMBULATORY_CARE_PROVIDER_SITE_OTHER): Payer: Self-pay

## 2024-09-08 VITALS — Ht 67.0 in | Wt 175.0 lb

## 2024-09-08 DIAGNOSIS — Z0000 Encounter for general adult medical examination without abnormal findings: Secondary | ICD-10-CM

## 2024-09-08 NOTE — Progress Notes (Signed)
 Chief Complaint  Patient presents with   Medicare Wellness     Subjective:   Tonya Pacheco is a 78 y.o. female who presents for a [employer] Visit.  Allergies (verified) Penicillins   History: Past Medical History:  Diagnosis Date   Allergy Jan 2000   Heart murmur Jan 2012   Hypertension    Past Surgical History:  Procedure Laterality Date   COLONOSCOPY WITH PROPOFOL  N/A 09/15/2020   Procedure: COLONOSCOPY WITH PROPOFOL ;  Surgeon: Eartha Angelia Sieving, MD;  [facility];  Service: Gastroenterology;  Laterality: N/A;  915   Family History  Problem Relation Age of Onset   Hyperlipidemia Mother    Hyperlipidemia Father    Arthritis Sister 45       Rheumatoid Arthritis   Heart disease Sister    Lupus Sister    Kidney disease Brother 50       Kidney transplant   Cancer Maternal Grandmother    Breast cancer Neg Hx    Social History   Occupational History   Not on file  Tobacco Use   Smoking status: Never   Smokeless tobacco: Never  Vaping Use   Vaping status: Never Used  Substance and Sexual Activity   Alcohol use: No   Drug use: No   Sexual activity: Not Currently   Tobacco Counseling Counseling given: Yes  SDOH Screenings   Food Insecurity: No Food Insecurity (09/04/2024)  Housing: Low Risk  (09/04/2024)  Transportation Needs: No Transportation Needs (09/04/2024)  Utilities: Not At Risk (09/08/2024)  Alcohol Screen: Low Risk  (08/27/2023)  Depression (PHQ2-9): Low Risk  (09/08/2024)  Financial Resource Strain: Low Risk  (09/04/2024)  Physical Activity: Sufficiently Active (09/04/2024)  Social Connections: Moderately Integrated (09/04/2024)  Stress: No Stress Concern Present (09/04/2024)  Tobacco Use: Low Risk  (09/08/2024)  Health Literacy: Adequate Health Literacy (09/08/2024)   Depression Screen    09/08/2024    1:03 PM 07/28/2024    8:15 AM 12/16/2023    3:44 PM 08/27/2023   12:57 PM 07/15/2023    4:17 PM 06/17/2023     4:14 PM 11/18/2022    3:23 PM  PHQ 2/9 Scores  PHQ - 2 Score 0 2 0 0 0 0 0  PHQ- 9 Score 0 3  0  0         Data saved with a previous flowsheet row definition     Goals Addressed               This Visit's Progress     I want to learn how to set boundaries and say no more often (pt-stated)         Visit info / Clinical Intake: Medicare Wellness Visit Type:: Subsequent Annual Wellness Visit Persons participating in visit:: patient Medicare Wellness Visit Mode:: Video Because this visit was a virtual/telehealth visit:: pt reported vitals If Telephone or Video please confirm:: I connected with the patient using audio enabled telemedicine application and verified that I am speaking with the correct person using two identifiers; I discussed the limitations of evaluation and management by telemedicine; The patient expressed understanding and agreed to proceed Patient Location:: home Provider Location:: office Information given by:: patient Interpreter Needed?: No Pre-visit prep was completed: yes AWV questionnaire completed by patient prior to visit?: yes Date:: 09/04/24 Living arrangements:: (!) lives alone Patient's Overall Health Status Rating: very good Typical amount of pain: none Does pain affect daily life?: no Are you currently prescribed opioids?: no  Dietary Habits and Nutritional Risks How many meals a day?: 3 Eats fruit and vegetables daily?: yes Most meals are obtained by: preparing own meals In the last 2 weeks, have you had any of the following?: none Diabetic:: no  Functional Status Activities of Daily Living (to include ambulation/medication): (Patient-Rptd) Independent Ambulation: Dependent Medication Administration: Independent Home Management: (Patient-Rptd) Independent Manage your own finances?: yes Primary transportation is: driving Concerns about hearing?: no  Fall Screening Falls in the past year?: (Patient-Rptd) 0 Number of falls in past  year: (Patient-Rptd) 0 Was there an injury with Fall?: (Patient-Rptd) 0 Fall Risk Category Calculator: (Patient-Rptd) 0 Patient Fall Risk Level: (Patient-Rptd) Low Fall Risk  Fall Risk Patient at Risk for Falls Due to: No Fall Risks Fall risk Follow up: Falls evaluation completed; Education provided; Falls prevention discussed  Home and Transportation Safety: All rugs have non-skid backing?: yes All stairs or steps have railings?: yes Grab bars in the bathtub or shower?: yes Have non-skid surface in bathtub or shower?: (!) no Good home lighting?: yes Regular seat belt use?: yes Hospital stays in the last year:: no  Cognitive Assessment Difficulty concentrating, remembering, or making decisions? : no Will 6CIT or Mini Cog be Completed: no 6CIT or Mini Cog Declined: patient alert, oriented, able to answer questions appropriately and recall recent events  Advance Directives (For Healthcare) Does Patient Have a Medical Advance Directive?: No Would patient like information on creating a medical advance directive?: No - Patient declined  Reviewed/Updated  Reviewed/Updated: Reviewed All (Medical, Surgical, Family, Medications, Allergies, Care Teams, Patient Goals)        Objective:    Today's Vitals   09/08/24 1251  Weight: 175 lb (79.4 kg)  Height: 5' 7 (1.702 m)   Body mass index is 27.41 kg/m.  Current Medications (verified) Outpatient Encounter Medications as of 09/08/2024  Medication Sig   amLODipine  (NORVASC ) 5 MG tablet TAKE 1 AND 1/2 TABLETS(7.5 MG) BY MOUTH DAILY   aspirin 81 MG tablet Take 81 mg by mouth daily.   calcium carbonate (OS-CAL) 600 MG TABS tablet Take 600 mg by mouth every evening.    Cholecalciferol (D3 2000 PO) Take 2,000 Units by mouth daily.   Evolocumab  (REPATHA  SURECLICK) 140 MG/ML SOAJ Inject 140 mg into the skin every 14 (fourteen) days.   Multiple Vitamins-Minerals (CENTRUM ADULTS PO) Take 1 tablet by mouth every evening.    No  facility-administered encounter medications on file as of 09/08/2024.   Hearing/Vision screen Hearing Screening - Comments:: Patient denies any hearing difficulties.   Vision Screening - Comments:: Patient does not have an eye doctor. A list of eye doctors has been provided to the patient.   Immunizations and Health Maintenance Health Maintenance  Topic Date Due   DTaP/Tdap/Td (1 - Tdap) Never done   Zoster Vaccines- Shingrix (1 of 2) Never done   Influenza Vaccine  05/28/2024   COVID-19 Vaccine (5 - 2025-26 season) 06/28/2024   Mammogram  04/09/2025   DEXA SCAN  07/01/2025   Medicare Annual Wellness (AWV)  09/08/2025   Pneumococcal Vaccine: 50+ Years  Completed   Hepatitis C Screening  Completed   Meningococcal B Vaccine  Aged Out   Colonoscopy  Discontinued        Assessment/Plan:  This is a routine wellness examination for Tonya Pacheco.  Patient Care Team: Tobie Suzzane POUR, MD as PCP - [facility] ([facility], [facility]  (Optometry) Kate Lonni CROME, MD as Consulting Physician (Cardiology)  I have personally  reviewed and noted the following in the patient's chart:   Medical and social history Use of alcohol, tobacco or illicit drugs  Current medications and supplements including opioid prescriptions. Functional ability and status Nutritional status Physical activity Advanced directives List of other physicians Hospitalizations, surgeries, and ER visits in previous 12 months Vitals Screenings to include cognitive, depression, and falls Referrals and appointments  No orders of the defined types were placed in this encounter.  In addition, I have reviewed and discussed with patient certain preventive protocols, quality metrics, and best practice recommendations. A written personalized care plan for preventive services as well as general preventive health recommendations were provided to patient.   Salvador Bigbee, CMA   09/08/2024    Return on September 09, 2025 at 3:10 pm, for your yearly Medicare Wellness Visit.  After Visit Summary: (MyChart) Due to this being a telephonic visit, the after visit summary with patients personalized plan was offered to patient via MyChart   Nurse Notes: n/a

## 2024-09-08 NOTE — Patient Instructions (Signed)
 Tonya Pacheco,  Thank you for taking the time for your Medicare Wellness Visit. I appreciate your continued commitment to your health goals. Please review the care plan we discussed, and feel free to reach out if I can assist you further.  Please note that Annual Wellness Visits do not include a physical exam. Some assessments may be limited, especially if the visit was conducted virtually. If needed, we may recommend an in-person follow-up with your provider.  Ongoing Care Seeing your primary care provider every 3 to 6 months helps us  monitor your health and provide consistent, personalized care.   Referrals If a referral was made during today's visit and you haven't received any updates within two weeks, please contact the referred provider directly to check on the status.   Recommended Screenings:  Health Maintenance  Topic Date Due   DTaP/Tdap/Td vaccine (1 - Tdap) Never done   Zoster (Shingles) Vaccine (1 of 2) Never done   Flu Shot  05/28/2024   COVID-19 Vaccine (5 - 2025-26 season) 06/28/2024   Breast Cancer Screening  04/09/2025   DEXA scan (bone density measurement)  07/01/2025   Medicare Annual Wellness Visit  09/08/2025   Pneumococcal Vaccine for age over 22  Completed   Hepatitis C Screening  Completed   Meningitis B Vaccine  Aged Out   Colon Cancer Screening  Discontinued       09/04/2024   10:50 AM  Advanced Directives  Does Patient Have a Medical Advance Directive? No  Would patient like information on creating a medical advance directive? No - Patient declined    Vision: Annual vision screenings are recommended for early detection of glaucoma, cataracts, and diabetic retinopathy. These exams can also reveal signs of chronic conditions such as diabetes and high blood pressure.  Dental: Annual dental screenings help detect early signs of oral cancer, gum disease, and other conditions linked to overall health, including heart disease and diabetes.  Please see the  attached documents for additional preventive care recommendations.

## 2024-10-22 ENCOUNTER — Other Ambulatory Visit: Payer: Self-pay | Admitting: Internal Medicine

## 2024-10-30 ENCOUNTER — Other Ambulatory Visit: Payer: Self-pay | Admitting: Internal Medicine

## 2024-10-30 DIAGNOSIS — E7800 Pure hypercholesterolemia, unspecified: Secondary | ICD-10-CM

## 2024-11-01 ENCOUNTER — Encounter (HOSPITAL_COMMUNITY): Payer: Self-pay

## 2024-11-01 ENCOUNTER — Ambulatory Visit (HOSPITAL_COMMUNITY)

## 2024-11-02 ENCOUNTER — Telehealth: Payer: Self-pay | Admitting: Cardiology

## 2024-11-02 NOTE — Telephone Encounter (Signed)
 Pt states that she was told to c/b this month in regards to grant for Repatha . Pt states that her pharmacy called to let her know that medication was ready but cost was $500. Pt would like a c/b regarding this matter. Please advise

## 2024-11-03 ENCOUNTER — Other Ambulatory Visit: Payer: Self-pay | Admitting: *Deleted

## 2024-11-03 DIAGNOSIS — I350 Nonrheumatic aortic (valve) stenosis: Secondary | ICD-10-CM

## 2024-11-04 ENCOUNTER — Telehealth: Payer: Self-pay | Admitting: Pharmacy Technician

## 2024-11-04 ENCOUNTER — Ambulatory Visit (HOSPITAL_COMMUNITY)
Admission: RE | Admit: 2024-11-04 | Discharge: 2024-11-04 | Disposition: A | Discharge status: Home / Self Care | Source: Ambulatory Visit | Attending: Cardiovascular Disease | Admitting: Cardiovascular Disease

## 2024-11-04 DIAGNOSIS — I350 Nonrheumatic aortic (valve) stenosis: Secondary | ICD-10-CM | POA: Diagnosis present

## 2024-11-04 LAB — ECHOCARDIOGRAM COMPLETE
AR max vel: 1.08 cm2
AV Area VTI: 0.95 cm2
AV Area mean vel: 1.02 cm2
AV Mean grad: 16 mmHg
AV Peak grad: 28.7 mmHg
Ao pk vel: 2.68 m/s
Area-P 1/2: 3.31 cm2
P 1/2 time: 463 ms
S' Lateral: 2.2 cm

## 2024-11-04 NOTE — Telephone Encounter (Signed)
 Effective: 10/21/24-10/20/25   APW:[id number] ERW:[id number] Hmnle:[id number] PI:[id number]

## 2024-11-04 NOTE — Telephone Encounter (Signed)
 Repatha    Patient Advocate Encounter   The patient was approved for a [employer] grant that will help cover the cost of repatha  Total amount awarded, 2500.  Effective: 10/21/24-10/20/25   APW:[id number] ERW:[id number] Hmnle:[id number] PI:[id number] [employer] ID: [id number]   Pharmacy provided with approval and processing information. Patient informed via mychart    Waiting on diag verification to go through then will send to patient and pharmacy

## 2024-11-05 ENCOUNTER — Ambulatory Visit: Payer: Self-pay | Admitting: Cardiology

## 2024-11-05 NOTE — Telephone Encounter (Signed)
" ° °  Still waiting diag approval "

## 2024-11-07 NOTE — Progress Notes (Unsigned)
 " Cardiology Office Note:    Date:  11/09/2024   ID:  Tonya Pacheco, DOB 08-16-1946, MRN [id number]  PCP:  Tobie Suzzane POUR, MD  Cardiologist:  None  Electrophysiologist:  None   Referring MD: Tobie Suzzane POUR, MD   Chief Complaint  Patient presents with   Aortic Stenosis    History of Present Illness:    Tonya Pacheco is a 79 y.o. female with a hx of aortic stenosis, hypertension, hyperlipidemia who presents for follow-up.  She was referred by Dr. Tobie for evaluation of heart murmur, initially seen on 11/02/2021.    Echocardiogram 11/26/2021 showed normal biventricular function, grade 1 diastolic dysfunction, moderate left atrial enlargement, mild MR, moderate AS.  Calcium score 12/06/2021 with 210 (83rd percentile).  Echocardiogram 10/2022 showed EF 60 to 65%, normal RV function, moderate to severe aortic stenosis (V-max 2.7 m/s, mean gradient 18 mmHg, AVA 1.0 cm, DI 0.33).  Echocardiogram 10/2023 showed EF 55 to 60%, normal RV function, moderate aortic stenosis (V-max 2.7 m/s, mean gradient 16 mmHg, AVA 1.2 cm, DI 0.41).  Echocardiogram 10/2024 showed moderate to severe aortic stenosis; by my read remains moderate (V-max 2.7 m/s, mean gradient 16 mmHg, AVA 1.0 cm, DI 0.30).  Since last clinic visit, she reports she is doing well.  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  She does workouts for 15-30 minutes each morning. Denies any chest pain, dyspnea, lightheadedness, syncope, lower extremity edema, or palpitations.     BP Readings from Last 3 Encounters:  11/09/24 138/74  07/28/24 136/76  12/27/23 (!) 154/89      Past Medical History:  Diagnosis Date   Allergy Jan 2000   Heart murmur Jan 2012   Hypertension     Past Surgical History:  Procedure Laterality Date   COLONOSCOPY WITH PROPOFOL  N/A 09/15/2020   Procedure: COLONOSCOPY WITH PROPOFOL ;  Surgeon: Eartha Angelia Sieving, MD;  [facility];  Service: Gastroenterology;  Laterality: N/A;  915     Current Medications: Current Meds  Medication Sig   amLODipine  (NORVASC ) 5 MG tablet TAKE 1 AND 1/2 TABLETS(7.5 MG) BY MOUTH DAILY   aspirin 81 MG tablet Take 81 mg by mouth daily.   calcium carbonate (OS-CAL) 600 MG TABS tablet Take 600 mg by mouth every evening.    Cholecalciferol (D3 2000 PO) Take 2,000 Units by mouth daily.   Multiple Vitamins-Minerals (CENTRUM ADULTS PO) Take 1 tablet by mouth every evening.    REPATHA  SURECLICK 140 MG/ML SOAJ INJECT 140 MG(1ML) UNDER THE SKIN ONCE EVERY 14 DAYS.     Allergies:   Penicillins   Social History   Socioeconomic History   Marital status: Widowed    Spouse name: Not on file   Number of children: 4   Years of education: Not on file   Highest education level: Bachelor's degree (e.g., BA, AB, BS)  Occupational History   Not on file  Tobacco Use   Smoking status: Never   Smokeless tobacco: Never  Vaping Use   Vaping status: Never Used  Substance and Sexual Activity   Alcohol use: No   Drug use: No   Sexual activity: Not Currently  Other Topics Concern   Not on file  Social History Narrative   Working scientific laboratory technician   Social Drivers of Health   Tobacco Use: Low Risk (09/08/2024)   Patient History    Smoking Tobacco Use: Never    Smokeless Tobacco Use: Never    Passive Exposure: Not  on file  Financial Resource Strain: Low Risk (09/04/2024)   Overall Financial Resource Strain (CARDIA)    Difficulty of Paying Living Expenses: Not hard at all  Food Insecurity: No Food Insecurity (09/04/2024)   Epic    Worried About Programme Researcher, Broadcasting/film/video in the Last Year: Never true    Ran Out of Food in the Last Year: Never true  Transportation Needs: No Transportation Needs (09/04/2024)   Epic    Lack of Transportation (Medical): No    Lack of Transportation (Non-Medical): No  Physical Activity: Sufficiently Active (09/04/2024)   Exercise Vital Sign    Days of Exercise per Week: 5 days    Minutes of Exercise per Session: 30 min   Stress: No Stress Concern Present (09/04/2024)   [employer] of Occupational Health - Occupational Stress Questionnaire    Feeling of Stress: Not at all  Social Connections: Moderately Integrated (09/04/2024)   Social Connection and Isolation Panel    Frequency of Communication with Friends and Family: More than three times a week    Frequency of Social Gatherings with Friends and Family: More than three times a week    Attends Religious Services: More than 4 times per year    Active Member of [employer] or Organizations: Yes    Attends Banker Meetings: More than 4 times per year    Marital Status: Widowed  Depression (PHQ2-9): Low Risk (09/08/2024)   Depression (PHQ2-9)    PHQ-2 Score: 0  Alcohol Screen: Low Risk (08/27/2023)   Alcohol Screen    Last Alcohol Screening Score (AUDIT): 0  Housing: Low Risk (09/04/2024)   Epic    Unable to Pay for Housing in the Last Year: No    Number of Times Moved in the Last Year: 0    Homeless in the Last Year: No  Utilities: Not At Risk (09/08/2024)   Epic    Threatened with loss of utilities: No  Health Literacy: Adequate Health Literacy (09/08/2024)   B1300 Health Literacy    Frequency of need for help with medical instructions: Never     Family History: The patient's family history includes Arthritis (age of onset: 67) in her sister; Cancer in her maternal grandmother; Heart disease in her sister; Hyperlipidemia in her father and mother; Kidney disease (age of onset: 50) in her brother; Lupus in her sister. There is no history of Breast cancer.  ROS:   Please see the history of present illness.     All other systems reviewed and are negative.  EKGs/Labs/Other Studies Reviewed:    The following studies were reviewed today:   EKG:   03/06/2023: Normal sinus rhythm, rate 81, no ST abnormality 11/26/2023: Normal sinus rhythm, rate 82, no ST abnormality 11/09/2024: Normal sinus rhythm, rate 78, no ST abnormality  Recent  Labs: 12/16/2023: Hemoglobin 12.4; Platelets 229; TSH 2.800 07/28/2024: ALT 21; BUN 15; Creatinine, Ser 1.00; Potassium 4.3; Sodium 140  Recent Lipid Panel    Component Value Date/Time   CHOL 222 (H) 07/28/2024 0854   TRIG 58 07/28/2024 0854   HDL 86 07/28/2024 0854   CHOLHDL 2.6 07/28/2024 0854   CHOLHDL 4.1 03/29/2020 0832   VLDL 18 07/30/2016 1007   LDLCALC 126 (H) 07/28/2024 0854   LDLCALC 201 (H) 03/29/2020 0832    Physical Exam:    VS:  BP 138/74 (BP Location: Left Arm, Patient Position: Sitting, Cuff Size: Large)   Pulse 78   Ht 5' 7 (1.702 m)   Wt  174 lb (78.9 kg)   SpO2 98%   BMI 27.25 kg/m     Wt Readings from Last 3 Encounters:  11/09/24 174 lb (78.9 kg)  09/08/24 175 lb (79.4 kg)  07/28/24 175 lb 6.4 oz (79.6 kg)     GEN:  Well nourished, well developed in no acute distress HEENT: Normal NECK: No JVD; No carotid bruits CARDIAC: RRR, 2 out of 6 systolic murmur RESPIRATORY:  Clear to auscultation without rales, wheezing or rhonchi  ABDOMEN: Soft, non-tender, non-distended MUSCULOSKELETAL:  No edema; No deformity  SKIN: Warm and dry NEUROLOGIC:  Alert and oriented x 3 PSYCHIATRIC:  Normal affect   ASSESSMENT:    1. Aortic valve stenosis, etiology of cardiac valve disease unspecified   2. Essential hypertension, benign   3. Hyperlipidemia, unspecified hyperlipidemia type      PLAN:    Aortic stenosis: Moderate on echocardiogram 11/26/2021.   Echocardiogram 10/2022 showed EF 60 to 65%, normal RV function, moderate to severe aortic stenosis (V-max 2.7 m/s, mean gradient 18 mmHg, AVA 1.0 cm, DI 0.33).  Echocardiogram 10/2023 showed EF 55 to 60%, normal RV function, moderate aortic stenosis (V-max 2.7 m/s, mean gradient 16 mmHg, AVA 1.2 cm, DI 0.41).  Echocardiogram 10/2024 showed moderate to severe aortic stenosis; by my read remains moderate (V-max 2.7 m/s, mean gradient 16 mmHg, AVA 1.0 cm, DI 0.30). -Remains asymptomatic.  Will continue to monitor, will  plan repeat echocardiogram in 1 year  Hypertension: On amlodipine  7.5 mg daily.  Appears controlled  Hyperlipidemia: Did not tolerate statin or Zetia .  LDL 202 on 09/03/2021.  Calcium score 12/06/2021 with 210 (83rd percentile).  Likely FH.  Started on Repatha  with significant improvement, LDL 66 on 11/18/2022.   -LDL was up to 126 07/2024, will update lipid panel -Continue Repatha   RTC in 1 year  Medication Adjustments/Labs and Tests Ordered: Current medicines are reviewed at length with the patient today.  Concerns regarding medicines are outlined above.  Orders Placed This Encounter  Procedures   Lipid panel   EKG 12-Lead   ECHOCARDIOGRAM COMPLETE   No orders of the defined types were placed in this encounter.   Patient Instructions  Medication Instructions:  Your physician recommends that you continue on your current medications as directed. Please refer to the Current Medication list given to you today.  *If you need a refill on your cardiac medications before your next appointment, please call your pharmacy*  Lab Work: LIPID TODAY   If you have labs (blood work) drawn today and your tests are completely normal, you will receive your results only by: MyChart Message (if you have MyChart) OR A paper copy in the mail If you have any lab test that is abnormal or we need to change your treatment, we will call you to review the results.  Testing/Procedures: Your physician has requested that you have an echocardiogram. Echocardiography is a painless test that uses sound waves to create images of your heart. It provides your doctor with information about the size and shape of your heart and how well your hearts chambers and valves are working. This procedure takes approximately one hour. There are no restrictions for this procedure. Please do NOT wear cologne, perfume, aftershave, or lotions (deodorant is allowed). Please arrive 15 minutes prior to your appointment time.  Please  note: We ask at that you not bring children with you during ultrasound (echo/ vascular) testing. Due to room size and safety concerns, children are not allowed in the ultrasound  rooms during exams. Our front office staff cannot provide observation of children in our lobby area while testing is being conducted. An adult accompanying a patient to their appointment will only be allowed in the ultrasound room at the discretion of the ultrasound technician under special circumstances. We apologize for any inconvenience.  TO BE DONE IN 1 YEAR   Follow-Up: At [facility], you and your health needs are our priority.  As part of our continuing mission to provide you with exceptional heart care, our providers are all part of one team.  This team includes your primary Cardiologist (physician) and Advanced Practice Providers or APPs (Physician Assistants and Nurse Practitioners) who all work together to provide you with the care you need, when you need it.  Your next appointment:   12 month(s) ABOUT A WEEK AFTER ECHO   Provider:   DR KATE OR One of our Advanced Practice Providers (APPs): Morse Clause, PA-C  Lamarr Satterfield, NP Miriam Shams, NP  Olivia Pavy, PA-C Josefa Beauvais, NP  Leontine Salen, PA-C Orren Fabry, PA-C  Hao Meng, PA-C Ernest Dick, NP  Damien Braver, NP Jon Hails, PA-C  Waddell Donath, PA-C    Dayna Dunn, PA-C  Scott Weaver, PA-C Lum Louis, NP Katlyn West, NP Callie Goodrich, PA-C  Xika Zhao, NP Sheng Haley, PA-C    Kathleen Johnson, PA-C   We recommend signing up for the patient portal called MyChart.  Sign up information is provided on this After Visit Summary.  MyChart is used to connect with patients for Virtual Visits (Telemedicine).  Patients are able to view lab/test results, encounter notes, upcoming appointments, etc.  Non-urgent messages can be sent to your provider as well.   To learn more about what you can do with MyChart, go to  [email or web address].   Other Instructions             Signed, Lonni LITTIE Kate, MD  11/09/2024 5:03 PM    [facility] "

## 2024-11-08 NOTE — Telephone Encounter (Signed)
" ° °  Hw grant under review "

## 2024-11-09 ENCOUNTER — Ambulatory Visit: Admitting: Cardiology

## 2024-11-09 ENCOUNTER — Telehealth: Payer: Self-pay | Admitting: *Deleted

## 2024-11-09 VITALS — BP 138/74 | HR 78 | Ht 67.0 in | Wt 174.0 lb

## 2024-11-09 DIAGNOSIS — I350 Nonrheumatic aortic (valve) stenosis: Secondary | ICD-10-CM

## 2024-11-09 DIAGNOSIS — E785 Hyperlipidemia, unspecified: Secondary | ICD-10-CM | POA: Diagnosis not present

## 2024-11-09 DIAGNOSIS — I10 Essential (primary) hypertension: Secondary | ICD-10-CM

## 2024-11-09 LAB — LIPID PANEL
Chol/HDL Ratio: 2.2 ratio (ref 0.0–4.4)
Cholesterol, Total: 192 mg/dL (ref 100–199)
HDL: 88 mg/dL
LDL Chol Calc (NIH): 92 mg/dL (ref 0–99)
Triglycerides: 66 mg/dL (ref 0–149)
VLDL Cholesterol Cal: 12 mg/dL (ref 5–40)

## 2024-11-09 NOTE — Telephone Encounter (Signed)
 Patient in office to see Dr Kate and was asking about the Repath grant  Will forward to PA team for follow up

## 2024-11-09 NOTE — Patient Instructions (Signed)
 Medication Instructions:  Your physician recommends that you continue on your current medications as directed. Please refer to the Current Medication list given to you today.  *If you need a refill on your cardiac medications before your next appointment, please call your pharmacy*  Lab Work: LIPID TODAY   If you have labs (blood work) drawn today and your tests are completely normal, you will receive your results only by: MyChart Message (if you have MyChart) OR A paper copy in the mail If you have any lab test that is abnormal or we need to change your treatment, we will call you to review the results.  Testing/Procedures: Your physician has requested that you have an echocardiogram. Echocardiography is a painless test that uses sound waves to create images of your heart. It provides your doctor with information about the size and shape of your heart and how well your hearts chambers and valves are working. This procedure takes approximately one hour. There are no restrictions for this procedure. Please do NOT wear cologne, perfume, aftershave, or lotions (deodorant is allowed). Please arrive 15 minutes prior to your appointment time.  Please note: We ask at that you not bring children with you during ultrasound (echo/ vascular) testing. Due to room size and safety concerns, children are not allowed in the ultrasound rooms during exams. Our front office staff cannot provide observation of children in our lobby area while testing is being conducted. An adult accompanying a patient to their appointment will only be allowed in the ultrasound room at the discretion of the ultrasound technician under special circumstances. We apologize for any inconvenience.  TO BE DONE IN 1 YEAR   Follow-Up: At [facility], you and your health needs are our priority.  As part of our continuing mission to provide you with exceptional heart care, our providers are all part of one team.  This team includes  your primary Cardiologist (physician) and Advanced Practice Providers or APPs (Physician Assistants and Nurse Practitioners) who all work together to provide you with the care you need, when you need it.  Your next appointment:   12 month(s) ABOUT A WEEK AFTER ECHO   Provider:   DR KATE OR One of our Advanced Practice Providers (APPs): Morse Clause, PA-C  Lamarr Satterfield, NP Miriam Shams, NP  Olivia Pavy, PA-C Josefa Beauvais, NP  Leontine Salen, PA-C Orren Fabry, PA-C  Hao Meng, PA-C Ernest Dick, NP  Damien Braver, NP Jon Hails, PA-C  Waddell Donath, PA-C    Dayna Dunn, PA-C  Scott Weaver, PA-C Lum Louis, NP Katlyn West, NP Callie Goodrich, PA-C  Xika Zhao, NP Sheng Haley, PA-C    Kathleen Johnson, PA-C   We recommend signing up for the patient portal called MyChart.  Sign up information is provided on this After Visit Summary.  MyChart is used to connect with patients for Virtual Visits (Telemedicine).  Patients are able to view lab/test results, encounter notes, upcoming appointments, etc.  Non-urgent messages can be sent to your provider as well.   To learn more about what you can do with MyChart, go to [email or web address].   Other Instructions

## 2024-11-10 ENCOUNTER — Encounter: Payer: Self-pay | Admitting: Pharmacy Technician

## 2024-11-10 ENCOUNTER — Ambulatory Visit: Payer: Self-pay | Admitting: Cardiology

## 2024-11-10 NOTE — Telephone Encounter (Addendum)
 Lorrene now approved     I called [facility] and they said it is filled somewhere else and showing too soon until 11/24/24  I called and left a message for the patient

## 2024-12-16 ENCOUNTER — Encounter: Admitting: Internal Medicine

## 2025-09-09 ENCOUNTER — Ambulatory Visit
